# Patient Record
Sex: Female | Born: 1970 | Race: White | Hispanic: No | Marital: Married | Smoking: Current every day smoker
Health system: Southern US, Community
[De-identification: ages and names within clinical notes are randomized; demographics above are authoritative.]

## PROBLEM LIST (undated history)

## (undated) DIAGNOSIS — M549 Dorsalgia, unspecified: Secondary | ICD-10-CM

## (undated) DIAGNOSIS — J45998 Other asthma: Secondary | ICD-10-CM

## (undated) DIAGNOSIS — G8929 Other chronic pain: Secondary | ICD-10-CM

## (undated) DIAGNOSIS — M797 Fibromyalgia: Secondary | ICD-10-CM

## (undated) HISTORY — DX: Fibromyalgia: M79.7

## (undated) HISTORY — PX: BREAST BIOPSY: SHX20

## (undated) HISTORY — DX: Other asthma: J45.998

---

## 2003-06-06 ENCOUNTER — Encounter: Payer: Self-pay | Admitting: Emergency Medicine

## 2003-06-06 ENCOUNTER — Emergency Department (HOSPITAL_COMMUNITY): Admission: EM | Admit: 2003-06-06 | Discharge: 2003-06-07 | Payer: Self-pay | Admitting: *Deleted

## 2004-11-28 ENCOUNTER — Emergency Department (HOSPITAL_COMMUNITY): Admission: EM | Admit: 2004-11-28 | Discharge: 2004-11-28 | Payer: Self-pay | Admitting: Emergency Medicine

## 2005-03-05 ENCOUNTER — Emergency Department: Payer: Self-pay | Admitting: Emergency Medicine

## 2005-03-06 ENCOUNTER — Ambulatory Visit: Payer: Self-pay | Admitting: Emergency Medicine

## 2005-03-14 ENCOUNTER — Ambulatory Visit: Payer: Self-pay | Admitting: Obstetrics and Gynecology

## 2007-02-06 ENCOUNTER — Emergency Department: Payer: Self-pay | Admitting: Emergency Medicine

## 2007-07-30 ENCOUNTER — Inpatient Hospital Stay (HOSPITAL_COMMUNITY): Admission: EM | Admit: 2007-07-30 | Discharge: 2007-08-03 | Payer: Self-pay | Admitting: Emergency Medicine

## 2007-07-30 ENCOUNTER — Ambulatory Visit: Payer: Self-pay | Admitting: Cardiothoracic Surgery

## 2007-08-10 ENCOUNTER — Encounter: Admission: RE | Admit: 2007-08-10 | Discharge: 2007-08-10 | Payer: Self-pay | Admitting: Cardiothoracic Surgery

## 2007-08-10 ENCOUNTER — Ambulatory Visit: Payer: Self-pay | Admitting: Cardiothoracic Surgery

## 2009-07-30 ENCOUNTER — Emergency Department (HOSPITAL_COMMUNITY): Admission: EM | Admit: 2009-07-30 | Discharge: 2009-07-30 | Payer: Self-pay | Admitting: Emergency Medicine

## 2009-12-23 ENCOUNTER — Ambulatory Visit: Payer: Self-pay | Admitting: Obstetrics and Gynecology

## 2010-01-01 ENCOUNTER — Emergency Department: Payer: Self-pay | Admitting: Emergency Medicine

## 2010-06-04 ENCOUNTER — Ambulatory Visit: Payer: Self-pay | Admitting: Sports Medicine

## 2011-03-26 LAB — DIFFERENTIAL
Basophils Absolute: 0 10*3/uL (ref 0.0–0.1)
Basophils Relative: 1 % (ref 0–1)
Eosinophils Absolute: 0.3 10*3/uL (ref 0.0–0.7)
Eosinophils Relative: 6 % — ABNORMAL HIGH (ref 0–5)
Lymphocytes Relative: 42 % (ref 12–46)
Lymphs Abs: 2.5 10*3/uL (ref 0.7–4.0)
Monocytes Absolute: 0.4 10*3/uL (ref 0.1–1.0)
Monocytes Relative: 6 % (ref 3–12)
Neutro Abs: 2.6 10*3/uL (ref 1.7–7.7)
Neutrophils Relative %: 45 % (ref 43–77)

## 2011-03-26 LAB — POCT I-STAT, CHEM 8
BUN: 13 mg/dL (ref 6–23)
Calcium, Ion: 1.17 mmol/L (ref 1.12–1.32)
Chloride: 106 mEq/L (ref 96–112)
Creatinine, Ser: 0.6 mg/dL (ref 0.4–1.2)
Glucose, Bld: 75 mg/dL (ref 70–99)
HCT: 41 % (ref 36.0–46.0)
Hemoglobin: 13.9 g/dL (ref 12.0–15.0)
Potassium: 4.6 mEq/L (ref 3.5–5.1)
Sodium: 140 mEq/L (ref 135–145)
TCO2: 26 mmol/L (ref 0–100)

## 2011-03-26 LAB — POCT CARDIAC MARKERS
CKMB, poc: <1 ng/mL — ABNORMAL LOW (ref 1.0–8.0)
Myoglobin, poc: 38.5 ng/mL (ref 12–200)
Troponin i, poc: <0.05 ng/mL (ref 0.00–0.09)

## 2011-03-26 LAB — CBC
HCT: 39.6 % (ref 36.0–46.0)
Hemoglobin: 13.5 g/dL (ref 12.0–15.0)
MCHC: 34.2 g/dL (ref 30.0–36.0)
MCV: 95.1 fL (ref 78.0–100.0)
Platelets: 459 10*3/uL — ABNORMAL HIGH (ref 150–400)
RBC: 4.16 MIL/uL (ref 3.87–5.11)
RDW: 13.5 % (ref 11.5–15.5)
WBC: 5.8 10*3/uL (ref 4.0–10.5)

## 2011-03-26 LAB — D-DIMER, QUANTITATIVE: D-Dimer, Quant: 0.32 ug/mL-FEU (ref 0.00–0.48)

## 2011-05-03 NOTE — H&P (Signed)
NAMESANTOSHA, Sabrina [name]               ACCOUNT NO.:  [id number]   MEDICAL RECORD NO.:  [id number]          PATIENT TYPE:  INP   LOCATION:  2010                         FACILITY:  [facility]   PHYSICIAN:  Kerin Perna, M.D.  DATE OF BIRTH:  09/28/1971   DATE OF ADMISSION:  07/30/2007  DATE OF DISCHARGE:                              HISTORY & PHYSICAL   ADMISSION DIAGNOSIS:  Spontaneous left pneumothorax, 15-20%.   CHIEF COMPLAINT:  Left chest pain.   PRIMARY CARE PHYSICIAN:  None.   PRESENT ILLNESS:  Sabrina [name] is a 40 year old smoker who presented to  the emergency department with her first episode of a left spontaneous  pneumothorax.  She has been a heavy smoker and has been unable to quit  despite several attempts.  She developed a pleuritic left chest pain  while driving on the date of admission.  There was no preceding trauma  or fall or heavy coughing.  She reported to the emergency department,  where her saturation was 98% on room air and her pulse was 70.  An EKG  was negative and her cardiac enzymes were negative.  A chest x-ray  showed a left apical pneumothorax of 15-20%.  She also had evidence of  COPD.   The patient had CT scans of the chest and abdomen in 2007 for pain.  The  chest CT scan indicates significant bullous emphysema bilaterally with  COPD and some evidence of pulmonary fibrosis.  The abdominal CT scan was  reported as negative.   PAST MEDICAL HISTORY:  1. Positive smoker.  2. No known drug allergies, codeine makes her nauseated.  3. History of a D&C, otherwise negative surgical history.   SOCIAL HISTORY:  The patient is employed, with a daughter.  She denies  alcohol but she smokes.   REVIEW OF SYSTEMS:  CONSTITUTIONAL:  No changes of weight loss or fever.  ENT:  Negative for difficulty swallowing or change in vision.  THORACIC:  Negative for a history of chest trauma, hemoptysis or prior history of  pneumothorax.  CARDIAC:  Negative.  GI:  Negative  for hepatitis,  jaundice or blood per rectum.  VASCULAR:  Negative for DVT.  ENDOCRINE:  Negative for diabetes or thyroid disease.  NEUROLOGIC:  Negative for  stroke or seizure.   PHYSICAL EXAMINATION:  She is 5 feet 3 inches and weight 120 pounds.  Vital signs:  Blood pressure 90/60, pulse 80, temperature 97.5,  saturations 95% on 2 L.  GENERAL APPEARANCE:  That of an appearance young white female in no  acute distress, on nasal cannula oxygen.  HEENT:  Normocephalic.  NECK:  Without crepitus or mass.  Breath sounds are slightly diminished on the left with mild rhonchi.  CARDIAC:  Regular rhythm without S3 gallop or murmur.  ABDOMEN:  Scaphoid, soft, without organomegaly.  EXTREMITIES:  No clubbing or cyanosis.  Peripheral pulses are intact.  NEUROLOGIC:  Intact.   LABORATORY DATA:  Her chest x-ray indicates a small left apical  pneumothorax with underlying fairly severe COPD.  Her white count is  9000, hematocrit is 41, platelet count  353.  Sodium 137, potassium 4.3,  BUN 5, glucose 93.   IMPRESSION AND PLAN:  The patient presents with first onset of a fairly  small spontaneous pneumothorax.  She has underlying significant chronic  obstructive pulmonary disease and bullous emphysema.  I do not feel a  chest tube placement is indicated, and we will admit the patient for  observation and follow.  She very well may need VATS with bleb resection  and pleurodesis; however, at this point proceeding with surgery would  probably expose her to significant pulmonary risks from active  bronchitis from heavy smoking, and we will make every effort to get her  to stop smoking.      Kerin Perna, M.D.  Electronically Signed     PV/MEDQ  D:  07/31/2007  T:  07/31/2007  Job:  [id number]

## 2011-05-03 NOTE — Assessment & Plan Note (Signed)
OFFICE VISIT   Sabrina [name], Sabrina [name]  DOB:  1971/02/05                                        August 10, 2007  CHART #:  [id number]   OFFICE NOTE   CURRENT PROBLEMS  1. Small spontaneous left pneumothorax, treated conservatively      07/30/2007.  2. History of smoking and tobacco abuse.  3. COPD on CT scan of the chest.   HISTORY OF PRESENT ILLNESS  Sabrina [name] returns for her first office visit after being  hospitalized for a left 15% to 20% spontaneous pneumothorax with  considerable pain which was managed conservatively with subsequent re-  expansion of the lung without a chest tube.  She has stopped smoking and  is taking only Ultram for pain now.  She does complain of constipation.  She is back at work and avoiding any heavy lifting or exertional  activities.  The chest pain has resolved, and her breathing is fine.   PHYSICAL EXAMINATION  Blood pressure 125/80, pulse 90, respirations 18, saturation 100%.  Breath sounds are clear and equal.  There is no crepitus.  Cardiac  rhythm is regular, and pulses are intact.   LABORATORY DATA  PA and lateral chest x-ray shows resolution of the pneumothorax with  clear lung fields.   IMPRESSION  Resolution of first episode of spontaneous left pneumothorax.  She knows  to obtain a chest x-ray  immediately if her symptoms recur.  She was again encouraged to remain  off cigarettes permanently.  She will return as needed.   Kerin Perna, M.D.  Electronically Signed   PV/MEDQ  D:  08/10/2007  T:  08/11/2007  Job:  [id number]   cc:   Loma Sender

## 2011-05-03 NOTE — Discharge Summary (Signed)
[name], [name]               ACCOUNT NO.:  [id number]   MEDICAL RECORD NO.:  [id number]          PATIENT TYPE:  INP   LOCATION:  2010                         FACILITY:  [facility]   PHYSICIAN:  Sabrina [name], M.D.  DATE OF BIRTH:  04/15/1971   DATE OF ADMISSION:  07/30/2007  DATE OF DISCHARGE:  08/03/2007                               DISCHARGE SUMMARY   HISTORY OF PRESENT ILLNESS:  The patient is a 40 year old smoker who  presented to the emergency department with a first episode of a left-  sided spontaneous pneumothorax.  She has been a heavy smoker and has  been unable to quit despite several attempts.  She developed a pleuritic  left chest pain while driving on the date of admission.  There was no  preceding trauma or fall or heavy coughing episode.  She presented to  the emergency department where her oxygen saturation was 98% on room air  and her pulse was 70.  An EKG was negative and her cardiac enzymes were  negative.  A chest x-ray showed a left apical pneumothorax of 15 to 20%  with evidence of COPD.  Patient has a history of previous CT scan of the  chest and abdomen in 2007 which shows significant bullous emphysema  bilaterally with COPD and some evidence of pulmonary fibrosis.  She was  felt to require admission for further evaluation and observation.   PAST MEDICAL HISTORY:  1. Tobacco abuse.  2. No known drug allergies.  CODEINE DOES MAKE HER NAUSEATED.  3. History of D&C, otherwise negative surgical history.   FAMILY HISTORY/SOCIAL HISTORY/REVIEW OF SYMPTOMS/PHYSICAL EXAMINATION:  Please see the history and physical done at the time of admission.   HOSPITAL COURSE:  Patient was admitted through the emergency room after  evaluation by Dr. Donata [name].  She was started on a course of Avelox for  evidence of bronchitis.  Her pain was controlled with narcotics.  A CT  scan was repeated on August 14 and this revealed moderate-to-severe bi-  apical paraseptal emphysema  with a 15 to 20% pneumothorax and a tiny  left effusion.  The patient showed some gradual improvement in her  pneumothorax on chest x-ray evaluation.  Her pain is almost entirely  resolved.  Her bronchitis symptoms have also improved.  She was deemed  to be acceptable for discharge on August 03, 2007.   FINAL DIAGNOSES:  Include moderate-to-severe emphysema with presenting  left-sided small pneumothorax which is stable.   OTHER DIAGNOSES:  Include chronic obstructive pulmonary disease with  tobacco abuse.   INSTRUCTIONS:  The patient has received written instructions in regard  to medications, activity, diet, wound care and followup.  She has also  received instructions on the smoking cessation program and has been  started on a Nicotine patch.   Other medications at discharge will include:  1. Ultram 50 mg one q.6 hours as needed.  2. Avelox 400 mg daily for an additional five days.   Followup will include a one-week appointment to see Dr. Donata [name] in the  office with a chest x-ray.  Sabrina [name], P.A.-C.      Sabrina [name], M.D.  Electronically Signed    WEG/MEDQ  D:  08/03/2007  T:  08/03/2007  Job:  [id number]

## 2011-10-03 LAB — CBC
HCT: 40.7
Hemoglobin: 13.9
MCHC: 34.1
MCV: 91.8
Platelets: 353
RBC: 4.43
RDW: 14.6 — ABNORMAL HIGH
WBC: 9

## 2011-10-03 LAB — BASIC METABOLIC PANEL
BUN: 5 — ABNORMAL LOW
CO2: 30
Calcium: 9.6
Chloride: 101
Creatinine, Ser: 0.75
GFR calc Af Amer: >60
GFR calc non Af Amer: >60
Glucose, Bld: 93
Potassium: 4.3
Sodium: 137

## 2011-10-03 LAB — DIFFERENTIAL
Basophils Absolute: 0
Basophils Relative: 0
Eosinophils Absolute: 0.2
Eosinophils Relative: 2
Lymphocytes Relative: 34
Lymphs Abs: 3
Monocytes Absolute: 0.7
Monocytes Relative: 8
Neutro Abs: 5
Neutrophils Relative %: 56

## 2011-10-03 LAB — URINALYSIS, ROUTINE W REFLEX MICROSCOPIC
Nitrite: NEGATIVE
Specific Gravity, Urine: 1.004 — ABNORMAL LOW
pH: 7.5

## 2011-10-03 LAB — POCT CARDIAC MARKERS
CKMB, poc: <1 — ABNORMAL LOW
Myoglobin, poc: 59.5
Operator id: 151321
Troponin i, poc: <0.05

## 2011-10-03 LAB — PREGNANCY, URINE: Preg Test, Ur: NEGATIVE

## 2011-10-03 LAB — D-DIMER, QUANTITATIVE: D-Dimer, Quant: <0.22

## 2011-10-13 ENCOUNTER — Encounter: Payer: Self-pay | Admitting: Family Medicine

## 2011-10-13 ENCOUNTER — Ambulatory Visit (INDEPENDENT_AMBULATORY_CARE_PROVIDER_SITE_OTHER): Payer: BC Managed Care – PPO | Admitting: Family Medicine

## 2011-10-13 DIAGNOSIS — M7710 Lateral epicondylitis, unspecified elbow: Secondary | ICD-10-CM

## 2011-10-13 DIAGNOSIS — M67919 Unspecified disorder of synovium and tendon, unspecified shoulder: Secondary | ICD-10-CM

## 2011-10-13 DIAGNOSIS — M7580 Other shoulder lesions, unspecified shoulder: Secondary | ICD-10-CM

## 2011-10-13 DIAGNOSIS — M7711 Lateral epicondylitis, right elbow: Secondary | ICD-10-CM

## 2011-10-13 MED ORDER — NITROGLYCERIN 0.2 MG/HR TD PT24: MEDICATED_PATCH | TRANSDERMAL | Status: DC

## 2011-10-13 MED ORDER — TRAMADOL HCL 50 MG PO TABS: 50.0000 mg | ORAL_TABLET | Freq: Four times a day (QID) | ORAL | Status: AC | PRN

## 2011-10-13 NOTE — Progress Notes (Signed)
PT referral info sent to [facility] in [place], [place]- requested [place]. Faxed to [phone].

## 2011-10-13 NOTE — Patient Instructions (Signed)
Rehab at [facility].  F/u 6 weeks

## 2011-10-14 ENCOUNTER — Encounter: Payer: Self-pay | Admitting: Family Medicine

## 2011-10-14 NOTE — Progress Notes (Signed)
Subjective:    Patient ID: Sabrina [name], female    DOB: 1971/02/29, 40 y.o.   MRN: [id number]  HPI  Pleasant female with a history of asthma and fibromyalgia who presents with a six-month history of escalating RIGHT lateral epicondylitis that is very severe in nature and  Has become relatively debilitating. She has no specific trauma or injury that she can recall. She also started to develop some right-sided shoulder pain within the last few months. She has pain at all times, and has difficulty with moving her elbow and is only able to sleep with propping up her elbow right now. She has no trauma or injury to this side. It has been gradual in onset but continued to become worse.  No radiculopathy, paresthesias. She did see a physician at [facility] orthopedics previously, and was evaluated and given a pneumatic arm band and a corticosteroid injection at the [facility] which lasted for 2 days.  Right-sided shoulder pain, pain with abduction, reaching across the body. No known injury. She has been feeling erring her RIGHT arm significantly and trying to minimally use it over the last few months. No prior traumatic fracture or injury or operative intervention the affected arm.  She does work at a [employer] and does do some repetitive motions.  There is no problem list on file for this patient.  Past Medical History  Diagnosis Date  . Asthma in remission   . Fibromyalgia    No past surgical history on file. History  Substance Use Topics  . Smoking status: Current Everyday Smoker -- 1.0 packs/day for 15 years  . Smokeless tobacco: Never Used  . Alcohol Use: Yes   Family History  Problem Relation Age of Onset  . Diabetes Other   . Hypertension     Allergies  Allergen Reactions  . Augmentin Swelling    Mouth burning and bumps, swelling  . Codeine Rash and Other (See Comments)    Abdominal pain   No current outpatient prescriptions on file prior to visit.    Review of  Systems REVIEW OF SYSTEMS  GEN: No fevers, chills. Nontoxic. Primarily MSK c/o today. MSK: Detailed in the HPI GI: tolerating PO intake without difficulty Neuro: No numbness, parasthesias, or tingling associated. Otherwise the pertinent positives of the ROS are noted above.      Objective:   Physical Exam   Physical Exam  Blood pressure 105/73, pulse 87, height 5\' 2"  (1.575 m), weight 124 lb (56.246 kg).  GEN: Well-developed,well-nourished,in no acute distress; alert,appropriate and cooperative throughout examination HEENT: Normocephalic and atraumatic without obvious abnormalities. Ears, externally no deformities PULM: Breathing comfortably in no respiratory distress EXT: No clubbing, cyanosis, or edema PSYCH: Normally interactive. Cooperative during the interview. Pleasant. Friendly and conversant. Not anxious or depressed appearing. Normal, full affect.  R elbow Ecchymosis or edema: neg Wrist extensor atrophy ROM: full flexion, extension, pronation, supination - she is able to achieve full range of motion, but has severe pain with terminal extension, flexion supination and pronation. Markedly tender to palpation, and around the lateral upper condyle even with slight touch Supination: 3+/5, PAINFUL Pronation: 4+/5 Wrist ext: 4-/5, painful Wrist flexion: 4+/5, painful No gross bony abnormality Varus and Valgus stress: stable ECRB tenderness: YES, TTP Medial epicondyle: NT Lateral epicondyle, resisted wrist extension from wrist full pronation and flexion: PAINFUL grip: 4/5  sensation intact  Shoulder: R Inspection: No muscle wasting or winging Ecchymosis/edema: neg  AC joint, scapula, clavicle: NT Cervical spine: NT, full ROM Spurling's:  neg Abduction: full, 5/5 Flexion: full, 5/5 IR, full, lift-off: 5/5 ER at neutral: full, 5/5 AC crossover: neg Neer: pos Hawkins: pos Drop Test: neg Empty Can: pos Supraspinatus insertion: mild-mod T Bicipital groove:  NT Speed's: neg Yergason's: neg Sulcus sign: neg Scapular dyskinesis: none C5-T1 intact  Neuro: Sensation intact Grip 5/5  Diagnostic Ultrasound Evaluation [employer] E, MSK ultrasound, MSK probe Anatomy scanned: R LE Indication: Pain Findings: at the point of maximal tenderness and at and around ECRB, there is no sign of acute tear in the tendon. There are a few small hyperechoic areas near the tendinous attachment which could correspond to calcification or prior injury. There appears to be no elbow effusion in the true elbow joint looks normal on ultrasound evaluation. Only one image was able to be saved due to some technical difficulties.       Assessment & Plan:   1. Right lateral epicondylitis  Ambulatory referral to Physical Therapy  2. Rotator cuff tendonitis  Ambulatory referral to Physical Therapy    Severe lateral epicondylitis, worsening. Emphasized the importance of home exercise, eccentric. At this point we're going to try  Her with some formal physical therapy, some Decadron iontophoresis at the lateral upper condyle, ASTYMM technique, and place her on NTG patch for her elbow.  Additionally, at the same time, will be the therapist work with her on her shoulder range of motion, and rotator cuff strengthening and scapular stabilization. I think she is developed some secondary cuff tendinopathy.  Recheck 5-6 weeks  The EHR lists Dr. Ashley Jacobs as provider, but I saw patient by myself as attending MD. I do not know how to alter this in the computer record.

## 2011-10-31 ENCOUNTER — Other Ambulatory Visit: Payer: Self-pay | Admitting: *Deleted

## 2011-10-31 MED ORDER — NITROGLYCERIN 0.2 MG/HR TD PT24: MEDICATED_PATCH | TRANSDERMAL | Status: DC

## 2011-12-01 ENCOUNTER — Ambulatory Visit: Payer: BC Managed Care – PPO | Admitting: Family Medicine

## 2012-12-19 HISTORY — PX: BREAST BIOPSY: SHX20

## 2013-05-08 ENCOUNTER — Ambulatory Visit: Payer: Self-pay | Admitting: Family Medicine

## 2013-05-15 ENCOUNTER — Ambulatory Visit: Payer: Self-pay | Admitting: Family Medicine

## 2013-06-03 ENCOUNTER — Ambulatory Visit: Payer: Self-pay | Admitting: Surgery

## 2013-06-04 LAB — PATHOLOGY REPORT

## 2013-06-17 ENCOUNTER — Ambulatory Visit: Payer: Self-pay | Admitting: General Surgery

## 2013-12-20 ENCOUNTER — Ambulatory Visit: Payer: Self-pay | Admitting: Surgery

## 2014-02-27 ENCOUNTER — Emergency Department (HOSPITAL_COMMUNITY): Payer: BC Managed Care – PPO

## 2014-02-27 ENCOUNTER — Emergency Department (HOSPITAL_COMMUNITY)
Admission: EM | Admit: 2014-02-27 | Discharge: 2014-02-28 | Disposition: A | Discharge status: Home / Self Care | Payer: BC Managed Care – PPO | Attending: Emergency Medicine | Admitting: Emergency Medicine

## 2014-02-27 ENCOUNTER — Encounter (HOSPITAL_COMMUNITY): Payer: Self-pay | Admitting: Emergency Medicine

## 2014-02-27 DIAGNOSIS — G8929 Other chronic pain: Secondary | ICD-10-CM | POA: Insufficient documentation

## 2014-02-27 DIAGNOSIS — G8911 Acute pain due to trauma: Secondary | ICD-10-CM | POA: Insufficient documentation

## 2014-02-27 DIAGNOSIS — R110 Nausea: Secondary | ICD-10-CM | POA: Insufficient documentation

## 2014-02-27 DIAGNOSIS — M545 Low back pain, unspecified: Secondary | ICD-10-CM

## 2014-02-27 DIAGNOSIS — M25551 Pain in right hip: Secondary | ICD-10-CM

## 2014-02-27 DIAGNOSIS — M25559 Pain in unspecified hip: Secondary | ICD-10-CM | POA: Insufficient documentation

## 2014-02-27 HISTORY — DX: Other chronic pain: G89.29

## 2014-02-27 HISTORY — DX: Dorsalgia, unspecified: M54.9

## 2014-02-27 MED ORDER — OXYCODONE-ACETAMINOPHEN 5-325 MG PO TABS
1.0000 | ORAL_TABLET | Freq: Once | ORAL | Status: AC
  Administered 2014-02-27: 1 via ORAL
  Filled 2014-02-27: qty 1

## 2014-02-27 MED ORDER — METHOCARBAMOL 500 MG PO TABS: 500.0000 mg | ORAL_TABLET | Freq: Two times a day (BID) | ORAL | Status: DC

## 2014-02-27 MED ORDER — KETOROLAC TROMETHAMINE 60 MG/2ML IM SOLN
60.0000 mg | Freq: Once | INTRAMUSCULAR | Status: AC
  Administered 2014-02-27: 60 mg via INTRAMUSCULAR
  Filled 2014-02-27: qty 2

## 2014-02-27 MED ORDER — NAPROXEN 500 MG PO TABS: 500.0000 mg | ORAL_TABLET | Freq: Two times a day (BID) | ORAL | Status: DC

## 2014-02-27 NOTE — ED Notes (Signed)
Pt states she has previous history of back "locking up" and compression of nerves. The treatment was physical therapy resolved without further treatment.

## 2014-02-27 NOTE — Discharge Instructions (Signed)
Make follow up appointment with [facility] as listed above. Take medications as directed for back pain/hip pain. Return to ED if you develop any worsening symptoms, leg numbness/weakness, or difficulty urinating.    Back Pain, Adult Back pain is very common. The pain often gets better over time. The cause of back pain is usually not dangerous. Most people can learn to manage their back pain on their own.  HOME CARE   Stay active. Start with short walks on flat ground if you can. Try to walk farther each day.  Do not sit, drive, or stand in one place for more than 30 minutes. Do not stay in bed.  Do not avoid exercise or work. Activity can help your back heal faster.  Be careful when you bend or lift an object. Bend at your knees, keep the object close to you, and do not twist.  Sleep on a firm mattress. Lie on your side, and bend your knees. If you lie on your back, put a pillow under your knees.  Only take medicines as told by your doctor.  Put ice on the injured area.  Put ice in a plastic bag.  Place a towel between your skin and the bag.  Leave the ice on for 15-20 minutes, 03-04 times a day for the first 2 to 3 days. After that, you can switch between ice and heat packs.  Ask your doctor about back exercises or massage.  Avoid feeling anxious or stressed. Find good ways to deal with stress, such as exercise. GET HELP RIGHT AWAY IF:   Your pain does not go away with rest or medicine.  Your pain does not go away in 1 week.  You have new problems.  You do not feel well.  The pain spreads into your legs.  You cannot control when you poop (bowel movement) or pee (urinate).  Your arms or legs feel weak or lose feeling (numbness).  You feel sick to your stomach (nauseous) or throw up (vomit).  You have belly (abdominal) pain.  You feel like you may pass out (faint). MAKE SURE YOU:   Understand these instructions.  Will watch your condition.  Will get  help right away if you are not doing well or get worse. Document Released: 05/23/2008 Document Revised: 02/27/2012 Document Reviewed: 04/25/2011 [facility] Patient Information 2014 [place] [employer], [place].

## 2014-02-27 NOTE — ED Notes (Signed)
Pt. reports worsening chronic right low back pain radiating to right leg  , seen by Dr. Floyce StakesGaines yesterday prescribed with Tramadol/Flexeril for her back pain with no relief . Denies recent fall or injury.

## 2014-02-27 NOTE — ED Notes (Signed)
Patient transported to X-ray 

## 2014-02-27 NOTE — ED Provider Notes (Signed)
CSN: [id number]     Arrival date & time 02/27/14  2008 History  This chart was scribed for non-physician practitioner [name], PA-C working with Darlys Galesavid Masneri, MD by Valera CastleSteven Perry, ED scribe. This patient was seen in room TR08C/TR08C and the patient's care was started at 9:34 PM.   Chief Complaint  Patient presents with  . Back Pain   (Consider location/radiation/quality/duration/timing/severity/associated sxs/prior Treatment) The history is provided by the patient. No language interpreter was used.   HPI Comments: Sabrina [name] is a 43 y.o. female who presents to the Emergency Department complaining of gradually worsening, spasmic, cramping, 8/10, right hip pain, with associated decreased ROM, onset 3:00PM today while sitting at her computer. She states when she went to turn in her chair to get up her hip locked up. She reports associated tingling/numbness in her right LE, and nausea due to the pain. She [facility]  She states he set up appointment for her at PT on Monday 03/03/14. She was given Tramadol and Flexeril that she has taken without relief. She last took both around 5:30PM.   She had similar hip pain, decreased ROM in 2011, went to PT. She states the therapist had her in certain position that "popped" her "back into place", with immediate relief.  She reports h/o back problems, dysfunctioning SI joint. 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 She denies any other symptoms. She reports an allergy to Codeine.      Past Medical History  Diagnosis Date  . Back pain, chronic    History reviewed. No pertinent past surgical history. No family history on file. History  Substance Use Topics  . Smoking status: Never Smoker   . Smokeless tobacco: Not on file  . Alcohol Use: No   OB History   Grav Para Term Preterm Abortions TAB SAB Ect Mult Living                 Review of Systems  Genitourinary: Negative.   All other  systems reviewed and are negative.   Allergies  Codeine  Home Medications   Current Outpatient Rx  Name  Route  Sig  Dispense  Refill  . albuterol (PROVENTIL HFA;VENTOLIN HFA) 108 (90 BASE) MCG/ACT inhaler   Inhalation   Inhale 2 puffs into the lungs every 6 (six) hours as needed for wheezing or shortness of breath.         . cyclobenzaprine (FLEXERIL) 5 MG tablet   Oral   Take 5 mg by mouth 3 (three) times daily as needed for muscle spasms.         . naproxen sodium (ANAPROX) 220 MG tablet   Oral   Take 440 mg by mouth daily as needed (swelling).         . traMADol (ULTRAM) 50 MG tablet   Oral   Take 50 mg by mouth every 4 (four) hours as needed for moderate pain.         . methocarbamol (ROBAXIN) 500 MG tablet   Oral   Take 1 tablet (500 mg total) by mouth 2 (two) times daily.   20 tablet   0   . naproxen (NAPROSYN) 500 MG tablet   Oral   Take 1 tablet (500 mg total) by mouth 2 (two) times daily.   30 tablet   0     BP 98/61  Pulse 74  Temp(Src) 98.2 F (36.8 C) (Oral)  Resp  16  Ht 5' 2.5" (1.588 m)  Wt 121 lb (54.885 kg)  BMI 21.76 kg/m2  SpO2 100%  Physical Exam  Nursing note and vitals reviewed. Constitutional: She is oriented to person, place, and time. She appears well-developed and well-nourished. No distress.  HENT:  Head: Normocephalic and atraumatic.  Eyes: Conjunctivae are normal.  Neck: No JVD present. No tracheal deviation present.  Cardiovascular: Normal rate, regular rhythm and intact distal pulses.  Exam reveals no gallop and no friction rub.   No murmur heard. Bilateral pedal pulses intact.   Pulmonary/Chest: Effort normal. No respiratory distress. She has no wheezes. She has no rhonchi. She has no rales.  Musculoskeletal: She exhibits tenderness. She exhibits no edema.       Right hip: She exhibits tenderness.  Bilateral Lower extremity sensation intact, with no evidence of numbness.  Neurological: She is alert and oriented to  person, place, and time. She has normal strength. No cranial nerve deficit or sensory deficit.  Reflex Scores:      Patellar reflexes are 2+ on the right side and 2+ on the left side. Skin: Skin is warm and dry. She is not diaphoretic.  Psychiatric: She has a normal mood and affect. Her behavior is normal.   ED Course  Procedures (including critical care time)  DIAGNOSTIC STUDIES: Oxygen Saturation is 100% on room air, normal by my interpretation.    COORDINATION OF CARE: 9:43 PM-Discussed treatment plan which includes right hip xray and pain medication with pt at bedside and pt agreed to plan.   Labs Review Labs Reviewed - No data to display Imaging Review Dg Hip Complete Right  02/27/2014   CLINICAL DATA:  Right hip pain, no known injury.  EXAM: RIGHT HIP - COMPLETE 2+ VIEW  COMPARISON:  None.  FINDINGS: No displaced fracture. No dislocation. No aggressive osseous lesion or overt degenerative changes. Overlying soft tissues unremarkable.  IMPRESSION: No acute osseous finding of the right hip.  Recommend MRI if concern for radiographically occult pathology persists.   Electronically Signed   By: Jearld Lesch M.D.   On: 02/27/2014 23:23     MDM   Final diagnoses:  LBP (low back pain)  Right hip pain  Patient afebrile with normal VS.  Plain films show no acute osseous findings of Right hip. Patient without midline spinal tenderness on Exam thus no need for further imaging at this time. Patient Lower Right Lumbar paraspinal muscles tight and tender to palpation. Suspect some degree of spasm.  The patient has no upper back or neck pain, no fever, no hx of cancer, IVDU, recent spinal procedures, weakness or numbness of the lower extremities and no urinary complaints including no retention or incontinence  Patient pain markedly improved with tx in ED. Plan to start patient on short course of Muscle relaxants and NSAIDs. Advised patient to discontinue Flexeril. Patient advised to keep  appointment with PT on Monday. Patient agrees with plan. Discharged in good condition.    Meds given in ED:  Medications  ketorolac (TORADOL) injection 60 mg (60 mg Intramuscular Given 02/27/14 2212)  oxyCODONE-acetaminophen (PERCOCET/ROXICET) 5-325 MG per tablet 1 tablet (1 tablet Oral Given 02/27/14 2212)    Discharge Medication List as of 02/27/2014 11:47 PM    START taking these medications   Details  methocarbamol (ROBAXIN) 500 MG tablet Take 1 tablet (500 mg total) by mouth 2 (two) times daily., Starting 02/27/2014, Until Discontinued, Print    naproxen (NAPROSYN) 500 MG tablet Take 1 tablet (  500 mg total) by mouth 2 (two) times daily., Starting 02/27/2014, Until Discontinued, Print       I personally performed the services described in this documentation, which was scribed in my presence. The recorded information has been reviewed and is accurate.    Rudene Anda, PA-C 03/01/14 1712

## 2014-03-03 NOTE — ED Provider Notes (Signed)
Medical screening examination/treatment/procedure(s) were performed by non-physician practitioner and as supervising physician I was immediately available for consultation/collaboration.   EKG Interpretation None        David Masneri, MD 03/03/14 1834 

## 2014-12-31 ENCOUNTER — Ambulatory Visit: Payer: Self-pay | Admitting: Family Medicine

## 2016-03-11 ENCOUNTER — Other Ambulatory Visit: Payer: Self-pay | Admitting: Family Medicine

## 2016-03-11 DIAGNOSIS — Z1231 Encounter for screening mammogram for malignant neoplasm of breast: Secondary | ICD-10-CM

## 2016-03-15 ENCOUNTER — Encounter: Payer: Self-pay | Admitting: Family Medicine

## 2016-03-28 ENCOUNTER — Ambulatory Visit: Payer: Self-pay | Attending: Family Medicine

## 2016-06-17 ENCOUNTER — Emergency Department
Admission: EM | Admit: 2016-06-17 | Discharge: 2016-06-17 | Disposition: A | Discharge status: Home / Self Care | Payer: BLUE CROSS/BLUE SHIELD | Attending: Emergency Medicine | Admitting: Emergency Medicine

## 2016-06-17 DIAGNOSIS — Z79899 Other long term (current) drug therapy: Secondary | ICD-10-CM | POA: Diagnosis not present

## 2016-06-17 DIAGNOSIS — J45909 Unspecified asthma, uncomplicated: Secondary | ICD-10-CM | POA: Diagnosis not present

## 2016-06-17 DIAGNOSIS — Z791 Long term (current) use of non-steroidal anti-inflammatories (NSAID): Secondary | ICD-10-CM | POA: Diagnosis not present

## 2016-06-17 DIAGNOSIS — M5441 Lumbago with sciatica, right side: Secondary | ICD-10-CM | POA: Diagnosis not present

## 2016-06-17 DIAGNOSIS — M545 Low back pain: Secondary | ICD-10-CM | POA: Diagnosis present

## 2016-06-17 LAB — COMPREHENSIVE METABOLIC PANEL
ALK PHOS: 85 U/L (ref 38–126)
ALT: 14 U/L (ref 14–54)
ANION GAP: 11 (ref 5–15)
AST: 21 U/L (ref 15–41)
Albumin: 4.7 g/dL (ref 3.5–5.0)
BILIRUBIN TOTAL: 0.5 mg/dL (ref 0.3–1.2)
BUN: 11 mg/dL (ref 6–20)
CALCIUM: 9.9 mg/dL (ref 8.9–10.3)
CO2: 28 mmol/L (ref 22–32)
Chloride: 100 mmol/L — ABNORMAL LOW (ref 101–111)
Creatinine, Ser: 0.71 mg/dL (ref 0.44–1.00)
GFR calc non Af Amer: >60 mL/min (ref >60)
Glucose, Bld: 99 mg/dL (ref 65–99)
Potassium: 4.2 mmol/L (ref 3.5–5.1)
SODIUM: 139 mmol/L (ref 135–145)
TOTAL PROTEIN: 8.2 g/dL — AB (ref 6.5–8.1)

## 2016-06-17 LAB — CBC WITH DIFFERENTIAL/PLATELET
Basophils Absolute: 0.1 10*3/uL (ref 0–0.1)
Basophils Relative: 1 %
EOS ABS: 0.2 10*3/uL (ref 0–0.7)
Eosinophils Relative: 3 %
HEMATOCRIT: 43.7 % (ref 35.0–47.0)
HEMOGLOBIN: 15 g/dL (ref 12.0–16.0)
LYMPHS ABS: 2.1 10*3/uL (ref 1.0–3.6)
Lymphocytes Relative: 25 %
MCH: 31 pg (ref 26.0–34.0)
MCHC: 34.4 g/dL (ref 32.0–36.0)
MCV: 90.3 fL (ref 80.0–100.0)
MONOS PCT: 6 %
Monocytes Absolute: 0.5 10*3/uL (ref 0.2–0.9)
NEUTROS PCT: 65 %
Neutro Abs: 5.4 10*3/uL (ref 1.4–6.5)
Platelets: 357 10*3/uL (ref 150–440)
RBC: 4.84 MIL/uL (ref 3.80–5.20)
RDW: 13.7 % (ref 11.5–14.5)
WBC: 8.2 10*3/uL (ref 3.6–11.0)

## 2016-06-17 LAB — URINALYSIS COMPLETE WITH MICROSCOPIC (ARMC ONLY)
BILIRUBIN URINE: NEGATIVE
GLUCOSE, UA: NEGATIVE mg/dL
HGB URINE DIPSTICK: NEGATIVE
Ketones, ur: NEGATIVE mg/dL
Leukocytes, UA: NEGATIVE
NITRITE: NEGATIVE
Protein, ur: NEGATIVE mg/dL
RBC / HPF: NONE SEEN RBC/hpf (ref 0–5)
SPECIFIC GRAVITY, URINE: 1.003 — AB (ref 1.005–1.030)
pH: 7 (ref 5.0–8.0)

## 2016-06-17 LAB — TROPONIN I

## 2016-06-17 LAB — POCT PREGNANCY, URINE: PREG TEST UR: NEGATIVE

## 2016-06-17 LAB — PREGNANCY, URINE: Preg Test, Ur: NEGATIVE

## 2016-06-17 MED ORDER — PREDNISONE 20 MG PO TABS: 40.0000 mg | ORAL_TABLET | Freq: Every day | ORAL | Status: DC

## 2016-06-17 MED ORDER — HYDROCODONE-ACETAMINOPHEN 5-325 MG PO TABS
1.0000 | ORAL_TABLET | ORAL | Status: DC | PRN
Start: 2016-06-17 — End: 2020-02-19

## 2016-06-17 NOTE — ED Notes (Signed)
Pt. Verbalizes understanding of d/c instructions, prescriptions, and follow-up. VS stable.  Pt. In NAD at time of d/c and denies concerns. Pt. Out of the unit by wheelchair with husband in good spirits

## 2016-06-17 NOTE — ED Notes (Signed)
Pt states that she felt a significant pop in her rt lower back Saturday, states that she became near syncopal at that time, pt states that after being up for approx 8 min she starts to feel light headed again as if she will soon pass out. Pt states that she has an upcoming appt with gi on the 17th because of frequent bloating and gas after eating. Pt denies losing blood in her stools, states her stools are normal

## 2016-06-17 NOTE — Discharge Instructions (Signed)

## 2016-06-17 NOTE — ED Provider Notes (Addendum)
[facility] Emergency Department Provider Note  Time seen: 3:16 PM  I have reviewed the triage vital signs and the nursing notes.   HISTORY  Chief Complaint Back Pain    HPI Sabrina [name] is a 45 y.o. female with a past medical history of fibromyalgia, chronic back pain, who presents the emergency department with lower back pain. According to the patient approximately 5 days ago she was getting out of the shower when she heard a pop in the back followed by significant lower back pain. Patient states she passed out at that time due to the pain. Has a history of back issues, is taking naproxen and was using an ice pack. She states she feels the pain is not improved, upon walking today she once again felt near syncopal due to the pain she called her orthopedic but they could not see her until Wednesday so she came to the emergency department for evaluation. Describes the pain as moderate while lying down, severe if she moves or attempts to walk. States the pain is mostly in the lower back and occasionally radiates down the right leg. Denies any weakness or numbness of the right leg. Denies any fever. Denies any vomiting.     Past Medical History  Diagnosis Date  . Asthma in remission   . Fibromyalgia   . Back pain, chronic     There are no active problems to display for this patient.   No past surgical history on file.  Current Outpatient Rx  Name  Route  Sig  Dispense  Refill  . albuterol (PROVENTIL HFA;VENTOLIN HFA) 108 (90 BASE) MCG/ACT inhaler   Inhalation   Inhale 2 puffs into the lungs every 6 (six) hours as needed for wheezing or shortness of breath.         . ALBUTEROL IN   Inhalation   Inhale 2 puffs into the lungs 2 (two) times daily as needed.           . cyclobenzaprine (FLEXERIL) 5 MG tablet   Oral   Take 5 mg by mouth 3 (three) times daily as needed for muscle spasms.         . methocarbamol (ROBAXIN) 500 MG tablet    Oral   Take 1 tablet (500 mg total) by mouth 2 (two) times daily.   20 tablet   0   . naproxen (NAPROSYN) 500 MG tablet   Oral   Take 1 tablet (500 mg total) by mouth 2 (two) times daily.   30 tablet   0   . naproxen sodium (ANAPROX) 220 MG tablet   Oral   Take 440 mg by mouth daily as needed (swelling).         . EXPIRED: nitroGLYCERIN (NITRODUR - DOSED IN MG/24 HR) 0.2 mg/hr      Apply 1/4 of a patch to the affected area and change every 24 hours reason tendinopathy    30 patch   4   . pregabalin (LYRICA) 100 MG capsule   Oral   Take 100 mg by mouth 3 (three) times daily.           . traMADol (ULTRAM) 50 MG tablet   Oral   Take 50 mg by mouth every 4 (four) hours as needed for moderate pain.           Allergies Codeine and Codeine  Family History  Problem Relation Age of Onset  . Diabetes Other   . Hypertension  Social History Social History  Substance Use Topics  . Smoking status: Never Smoker   . Smokeless tobacco: Not on file  . Alcohol Use: No    Review of Systems Constitutional: Negative for fever Cardiovascular: Negative for chest pain. Respiratory: Negative for shortness of breath. Gastrointestinal: Negative for abdominal pain Musculoskeletal: Lower back pain occasional radiation to the right leg. Neurological: Negative for headache 10-point ROS otherwise negative.  ____________________________________________   PHYSICAL EXAM:  VITAL SIGNS: ED Triage Vitals  Enc Vitals Group     BP 06/17/16 1336 95/67 mmHg     Pulse Rate 06/17/16 1336 79     Resp 06/17/16 1336 18     Temp 06/17/16 1336 97.6 F (36.4 C)     Temp Source 06/17/16 1336 Oral     SpO2 06/17/16 1336 96 %     Weight 06/17/16 1336 118 lb (53.524 kg)     Height 06/17/16 1336 5\' 2"  (1.575 m)     Head Cir --      Peak Flow --      Pain Score 06/17/16 1346 4     Pain Loc --      Pain Edu? --      Excl. in GC? --     Constitutional: Alert and oriented. Well  appearing and in no distress. Eyes: Normal exam ENT   Head: Normocephalic and atraumatic.   Mouth/Throat: Mucous membranes are moist. Cardiovascular: Normal rate, regular rhythm. No murmur Respiratory: Normal respiratory effort without tachypnea nor retractions. Breath sounds are clear  Gastrointestinal: Soft and nontender. No distention. Musculoskeletal: Moderate tenderness palpation of the mid L-spine, with right-sided paraspinal tenderness to palpation. Neurologic:  Normal speech and language. No gross focal neurologic deficits Skin:  Skin is warm, dry and intact.  Psychiatric: Mood and affect are normal  ____________________________________________    INITIAL IMPRESSION / ASSESSMENT AND PLAN / ED COURSE  Pertinent labs & imaging results that were available during my care of the patient were reviewed by me and considered in my medical decision making (see chart for details).  The patient presents to the emergency department with lower back pain. States occasional radiation down the right leg. Patient has a moderate tenderness palpation of the mid lumbar spine with right-sided paraspinal tenderness palpation. Neurologically intact. Overall the patient appears well, likely acute exacerbation of chronic back condition. We'll discharge with a short course of pain medication and a burst course of prednisone. Patient is agreeable to this plan.  I discussed imaging with the patient, she states she would prefer not to have imaging if I thought it was okay. As the patient has not experienced any trauma I do not believe imaging would be of much utility at this point and the patient does have follow-up with orthopedics in 5 days.  EKG reviewed and interpreted by myself shows normal sinus rhythm at 73 bpm, narrow QRS, normal axis, normal intervals, no ST changes. Normal EKG.  Labs are largely within normal limits including urinalysis and negative  troponin.  ____________________________________________   FINAL CLINICAL IMPRESSION(S) / ED DIAGNOSES  Lower back pain   Minna AntisKevin Laken Lobato, MD 06/17/16 1519  Minna AntisKevin Knut Rondinelli, MD 06/17/16 1520  Minna AntisKevin Bernese Doffing, MD 06/17/16 [phone]

## 2016-07-04 ENCOUNTER — Other Ambulatory Visit: Payer: Self-pay | Admitting: Student

## 2016-07-04 DIAGNOSIS — R1084 Generalized abdominal pain: Secondary | ICD-10-CM

## 2016-07-04 DIAGNOSIS — R140 Abdominal distension (gaseous): Secondary | ICD-10-CM

## 2016-07-11 ENCOUNTER — Ambulatory Visit
Admission: RE | Admit: 2016-07-11 | Discharge: 2016-07-11 | Disposition: A | Discharge status: Home / Self Care | Payer: BLUE CROSS/BLUE SHIELD | Source: Ambulatory Visit | Attending: Student | Admitting: Student

## 2016-07-11 DIAGNOSIS — R140 Abdominal distension (gaseous): Secondary | ICD-10-CM | POA: Insufficient documentation

## 2016-07-11 DIAGNOSIS — R1084 Generalized abdominal pain: Secondary | ICD-10-CM | POA: Insufficient documentation

## 2016-07-11 MED ORDER — IOPAMIDOL (ISOVUE-300) INJECTION 61%
75.0000 mL | Freq: Once | INTRAVENOUS | Status: AC | PRN
  Administered 2016-07-11: 75 mL via INTRAVENOUS

## 2017-01-31 ENCOUNTER — Ambulatory Visit: Payer: BLUE CROSS/BLUE SHIELD | Attending: Family Medicine

## 2017-03-15 ENCOUNTER — Ambulatory Visit (INDEPENDENT_AMBULATORY_CARE_PROVIDER_SITE_OTHER): Payer: BLUE CROSS/BLUE SHIELD | Admitting: Obstetrics & Gynecology

## 2017-03-15 ENCOUNTER — Encounter: Payer: Self-pay | Admitting: Obstetrics & Gynecology

## 2017-03-15 VITALS — BP 98/60 | HR 107 | Ht 62.0 in | Wt 115.0 lb

## 2017-03-15 DIAGNOSIS — Z124 Encounter for screening for malignant neoplasm of cervix: Secondary | ICD-10-CM

## 2017-03-15 DIAGNOSIS — Z1239 Encounter for other screening for malignant neoplasm of breast: Secondary | ICD-10-CM

## 2017-03-15 DIAGNOSIS — Z1231 Encounter for screening mammogram for malignant neoplasm of breast: Secondary | ICD-10-CM

## 2017-03-15 DIAGNOSIS — Z0000 Encounter for general adult medical examination without abnormal findings: Secondary | ICD-10-CM | POA: Diagnosis not present

## 2017-03-15 DIAGNOSIS — N926 Irregular menstruation, unspecified: Secondary | ICD-10-CM | POA: Diagnosis not present

## 2017-03-15 NOTE — Progress Notes (Signed)
HPI:      Ms. Ronn MelenaBridget Renay Howard Poucherry Hollis is a 46 y.o. G1P1001 who LMP was Patient's last menstrual period was 02/01/2017., she presents today for her annual examination. The patient has no complaints today. The patient is sexually active. Years ago-  last pap: was normal  The patient has regular exercise: yes.  Mood and period changes- premenopausal, not enough to want treatment.  GYN History: Contraception: tubal ligation  PMHx: She  has a past medical history of Asthma in remission; Back pain, chronic; and Fibromyalgia. Also,  has no past surgical history on file., family history includes Bone cancer in her maternal uncle and paternal aunt; Breast cancer (age of onset: 1149) in her mother; Cancer in her maternal grandmother; Colon cancer in her maternal grandfather; Diabetes in her other; Pancreatic cancer in her maternal aunt.,  reports that she has been smoking.  She has never used smokeless tobacco. She reports that she does not drink alcohol or use drugs.  She has a current medication list which includes the following prescription(s): albuterol, co-enzyme q-10, albuterol, cyclobenzaprine, hydrocodone-acetaminophen, and methocarbamol. Also, is allergic to clarithromycin; codeine; and codeine.  Review of Systems  Constitutional: Negative for chills, fever and malaise/fatigue.  HENT: Negative for congestion, sinus pain and sore throat.   Eyes: Negative for blurred vision and pain.  Respiratory: Negative for cough and wheezing.   Cardiovascular: Negative for chest pain and leg swelling.  Gastrointestinal: Negative for abdominal pain, constipation, diarrhea, heartburn, nausea and vomiting.  Genitourinary: Negative for dysuria, frequency, hematuria and urgency.  Musculoskeletal: Negative for back pain, joint pain, myalgias and neck pain.  Skin: Negative for itching and rash.  Neurological: Negative for dizziness, tremors and weakness.  Endo/Heme/Allergies: Does not bruise/bleed easily.    Psychiatric/Behavioral: Negative for depression. The patient is not nervous/anxious and does not have insomnia.     Objective: BP 98/60   Pulse (!) 107   Ht 5\' 2"  (1.575 m)   Wt 115 lb (52.2 kg)   LMP 02/01/2017   BMI 21.03 kg/m  Physical Exam  Constitutional: She is oriented to person, place, and time. She appears well-developed and well-nourished. No distress.  Genitourinary: Rectum normal, vagina normal and uterus normal. Pelvic exam was performed with patient supine. There is no rash or lesion on the right labia. There is no rash or lesion on the left labia. Vagina exhibits no lesion. No bleeding in the vagina. Right adnexum does not display mass and does not display tenderness. Left adnexum does not display mass and does not display tenderness. Cervix does not exhibit motion tenderness, lesion, friability or polyp.   Uterus is mobile and midaxial. Uterus is not enlarged or exhibiting a mass.  HENT:  Head: Normocephalic and atraumatic. Head is without laceration.  Right Ear: Hearing normal.  Left Ear: Hearing normal.  Nose: No epistaxis.  No foreign bodies.  Mouth/Throat: Uvula is midline, oropharynx is clear and moist and mucous membranes are normal.  Eyes: Pupils are equal, round, and reactive to light.  Neck: Normal range of motion. Neck supple. No thyromegaly present.  Cardiovascular: Normal rate and regular rhythm.  Exam reveals no gallop and no friction rub.   No murmur heard. Pulmonary/Chest: Effort normal and breath sounds normal. No respiratory distress. She has no wheezes. Right breast exhibits no mass, no skin change and no tenderness. Left breast exhibits no mass, no skin change and no tenderness.  Abdominal: Soft. Bowel sounds are normal. She exhibits no distension. There is no tenderness.  There is no rebound.  Musculoskeletal: Normal range of motion.  Neurological: She is alert and oriented to person, place, and time. No cranial nerve deficit.  Skin: Skin is warm and  dry.  Psychiatric: She has a normal mood and affect. Judgment normal.  Vitals reviewed.   Assessment:  ANNUAL EXAM 1. Annual physical exam   2. Irregular menses   3. Screening for breast cancer   4. Screening for cervical cancer      Screening Plan:            1.  Cervical Screening-  Pap smear done today, every 3 years  2. Breast screening- Exam annually and mammogram>40 planned   3. Colonoscopy every 10 years, Hemoccult testing - after age 33  4. Labs per PCP or scheduled here, pt to see.  May want menopausal labs has low concerns for fertility. Reassured.  5. Counseling for contraception: bilateral tubal ligation  Other:  1. Annual physical exam  2. Irregular menses Monitor, premenopause  3. Screening for breast cancer - MM DIGITAL SCREENING BILATERAL; Future  4. Screening for cervical cancer - IGP, Aptima HPV      F/U  Return in about 1 year (around 03/15/2018) for Annual.  Annamarie Major, MD, [facility], [facility] 03/15/2017  11:05 AM

## 2017-03-18 LAB — IGP, APTIMA HPV
HPV APTIMA: NEGATIVE
PAP Smear Comment: 0

## 2017-04-03 ENCOUNTER — Other Ambulatory Visit: Payer: Self-pay | Admitting: Obstetrics & Gynecology

## 2017-04-03 DIAGNOSIS — Z1231 Encounter for screening mammogram for malignant neoplasm of breast: Secondary | ICD-10-CM

## 2017-04-05 ENCOUNTER — Ambulatory Visit: Payer: Self-pay

## 2017-04-07 ENCOUNTER — Ambulatory Visit: Payer: Self-pay

## 2017-12-26 ENCOUNTER — Other Ambulatory Visit: Payer: Self-pay | Admitting: Family Medicine

## 2017-12-26 DIAGNOSIS — Z1239 Encounter for other screening for malignant neoplasm of breast: Secondary | ICD-10-CM

## 2018-01-12 ENCOUNTER — Ambulatory Visit
Admission: RE | Admit: 2018-01-12 | Discharge: 2018-01-12 | Disposition: A | Discharge status: Home / Self Care | Payer: BLUE CROSS/BLUE SHIELD | Source: Ambulatory Visit | Attending: Family Medicine | Admitting: Family Medicine

## 2018-01-12 ENCOUNTER — Encounter: Payer: Self-pay | Admitting: Radiology

## 2018-01-12 DIAGNOSIS — Z1239 Encounter for other screening for malignant neoplasm of breast: Secondary | ICD-10-CM

## 2018-01-12 DIAGNOSIS — Z1231 Encounter for screening mammogram for malignant neoplasm of breast: Secondary | ICD-10-CM | POA: Insufficient documentation

## 2020-02-08 ENCOUNTER — Inpatient Hospital Stay (HOSPITAL_COMMUNITY)
Admission: EM | Admit: 2020-02-08 | Discharge: 2020-02-19 | DRG: 021 | Disposition: A | Discharge status: Home Health | Payer: PRIVATE HEALTH INSURANCE | Attending: Neurosurgery | Admitting: Neurosurgery

## 2020-02-08 ENCOUNTER — Emergency Department (HOSPITAL_COMMUNITY): Payer: PRIVATE HEALTH INSURANCE

## 2020-02-08 ENCOUNTER — Encounter (HOSPITAL_COMMUNITY): Payer: Self-pay

## 2020-02-08 ENCOUNTER — Other Ambulatory Visit: Payer: Self-pay

## 2020-02-08 ENCOUNTER — Inpatient Hospital Stay (HOSPITAL_COMMUNITY): Payer: PRIVATE HEALTH INSURANCE

## 2020-02-08 DIAGNOSIS — Z803 Family history of malignant neoplasm of breast: Secondary | ICD-10-CM | POA: Diagnosis not present

## 2020-02-08 DIAGNOSIS — I608 Other nontraumatic subarachnoid hemorrhage: Secondary | ICD-10-CM

## 2020-02-08 DIAGNOSIS — Z881 Allergy status to other antibiotic agents status: Secondary | ICD-10-CM

## 2020-02-08 DIAGNOSIS — K5900 Constipation, unspecified: Secondary | ICD-10-CM | POA: Diagnosis not present

## 2020-02-08 DIAGNOSIS — Z885 Allergy status to narcotic agent status: Secondary | ICD-10-CM

## 2020-02-08 DIAGNOSIS — Z20822 Contact with and (suspected) exposure to covid-19: Secondary | ICD-10-CM | POA: Diagnosis present

## 2020-02-08 DIAGNOSIS — G8929 Other chronic pain: Secondary | ICD-10-CM | POA: Diagnosis present

## 2020-02-08 DIAGNOSIS — R6889 Other general symptoms and signs: Secondary | ICD-10-CM

## 2020-02-08 DIAGNOSIS — I609 Nontraumatic subarachnoid hemorrhage, unspecified: Secondary | ICD-10-CM

## 2020-02-08 DIAGNOSIS — I671 Cerebral aneurysm, nonruptured: Secondary | ICD-10-CM

## 2020-02-08 DIAGNOSIS — M797 Fibromyalgia: Secondary | ICD-10-CM | POA: Diagnosis present

## 2020-02-08 DIAGNOSIS — F17200 Nicotine dependence, unspecified, uncomplicated: Secondary | ICD-10-CM | POA: Diagnosis present

## 2020-02-08 DIAGNOSIS — Z800 Family history of malignant neoplasm of digestive organs: Secondary | ICD-10-CM

## 2020-02-08 DIAGNOSIS — R0602 Shortness of breath: Secondary | ICD-10-CM

## 2020-02-08 DIAGNOSIS — M545 Low back pain: Secondary | ICD-10-CM | POA: Diagnosis present

## 2020-02-08 DIAGNOSIS — J811 Chronic pulmonary edema: Secondary | ICD-10-CM | POA: Diagnosis present

## 2020-02-08 DIAGNOSIS — I602 Nontraumatic subarachnoid hemorrhage from anterior communicating artery: Secondary | ICD-10-CM | POA: Diagnosis present

## 2020-02-08 LAB — RAPID URINE DRUG SCREEN, HOSP PERFORMED
Amphetamines: NOT DETECTED
Barbiturates: NOT DETECTED
Benzodiazepines: NOT DETECTED
Cocaine: NOT DETECTED
Opiates: NOT DETECTED
Tetrahydrocannabinol: POSITIVE — AB

## 2020-02-08 LAB — CBC WITH DIFFERENTIAL/PLATELET
Abs Immature Granulocytes: 0.03 10*3/uL (ref 0.00–0.07)
Basophils Absolute: 0.1 10*3/uL (ref 0.0–0.1)
Basophils Relative: 1 %
Eosinophils Absolute: 0.2 10*3/uL (ref 0.0–0.5)
Eosinophils Relative: 2 %
HCT: 42 % (ref 36.0–46.0)
Hemoglobin: 13.8 g/dL (ref 12.0–15.0)
Immature Granulocytes: 0 %
Lymphocytes Relative: 22 %
Lymphs Abs: 2.1 10*3/uL (ref 0.7–4.0)
MCH: 30.7 pg (ref 26.0–34.0)
MCHC: 32.9 g/dL (ref 30.0–36.0)
MCV: 93.3 fL (ref 80.0–100.0)
Monocytes Absolute: 0.6 10*3/uL (ref 0.1–1.0)
Monocytes Relative: 7 %
Neutro Abs: 6.3 10*3/uL (ref 1.7–7.7)
Neutrophils Relative %: 68 %
Platelets: 422 10*3/uL — ABNORMAL HIGH (ref 150–400)
RBC: 4.5 MIL/uL (ref 3.87–5.11)
RDW: 14.1 % (ref 11.5–15.5)
WBC: 9.2 10*3/uL (ref 4.0–10.5)
nRBC: 0 % (ref 0.0–0.2)

## 2020-02-08 LAB — CK TOTAL AND CKMB (NOT AT ARMC)
CK, MB: 3.6 ng/mL (ref 0.5–5.0)
Relative Index: INVALID (ref 0.0–2.5)
Total CK: 83 U/L (ref 38–234)

## 2020-02-08 LAB — URINALYSIS, ROUTINE W REFLEX MICROSCOPIC
Bacteria, UA: NONE SEEN
Bilirubin Urine: NEGATIVE
Glucose, UA: NEGATIVE mg/dL
Hgb urine dipstick: NEGATIVE
Ketones, ur: NEGATIVE mg/dL
Leukocytes,Ua: NEGATIVE
Nitrite: NEGATIVE
Protein, ur: 30 mg/dL — AB
Specific Gravity, Urine: 1.015 (ref 1.005–1.030)
pH: 7 (ref 5.0–8.0)

## 2020-02-08 LAB — COMPREHENSIVE METABOLIC PANEL
ALT: 21 U/L (ref 0–44)
AST: 24 U/L (ref 15–41)
Albumin: 3.9 g/dL (ref 3.5–5.0)
Alkaline Phosphatase: 84 U/L (ref 38–126)
Anion gap: 12 (ref 5–15)
BUN: 12 mg/dL (ref 6–20)
CO2: 21 mmol/L — ABNORMAL LOW (ref 22–32)
Calcium: 9.3 mg/dL (ref 8.9–10.3)
Chloride: 107 mmol/L (ref 98–111)
Creatinine, Ser: 0.77 mg/dL (ref 0.44–1.00)
GFR calc Af Amer: >60 mL/min (ref >60)
GFR calc non Af Amer: >60 mL/min (ref >60)
Glucose, Bld: 142 mg/dL — ABNORMAL HIGH (ref 70–99)
Potassium: 4 mmol/L (ref 3.5–5.1)
Sodium: 140 mmol/L (ref 135–145)
Total Bilirubin: 0.4 mg/dL (ref 0.3–1.2)
Total Protein: 6.5 g/dL (ref 6.5–8.1)

## 2020-02-08 LAB — I-STAT BETA HCG BLOOD, ED (MC, WL, AP ONLY): I-stat hCG, quantitative: <5 m[IU]/mL (ref <5)

## 2020-02-08 LAB — SURGICAL PCR SCREEN
MRSA, PCR: NEGATIVE
Staphylococcus aureus: POSITIVE — AB

## 2020-02-08 LAB — APTT: aPTT: 26 seconds (ref 24–36)

## 2020-02-08 LAB — RESPIRATORY PANEL BY RT PCR (FLU A&B, COVID)
Influenza A by PCR: NEGATIVE
Influenza B by PCR: NEGATIVE
SARS Coronavirus 2 by RT PCR: NEGATIVE

## 2020-02-08 LAB — CBG MONITORING, ED: Glucose-Capillary: 148 mg/dL — ABNORMAL HIGH (ref 70–99)

## 2020-02-08 LAB — PROTIME-INR
INR: 1 (ref 0.8–1.2)
Prothrombin Time: 13 seconds (ref 11.4–15.2)

## 2020-02-08 LAB — TROPONIN I (HIGH SENSITIVITY)
Troponin I (High Sensitivity): 10 ng/L (ref <18)
Troponin I (High Sensitivity): 15 ng/L (ref <18)

## 2020-02-08 LAB — LACTIC ACID, PLASMA: Lactic Acid, Venous: 3 mmol/L (ref 0.5–1.9)

## 2020-02-08 MED ORDER — SODIUM CHLORIDE 0.9 % IV SOLN: INTRAVENOUS | Status: DC

## 2020-02-08 MED ORDER — ACETAMINOPHEN 160 MG/5ML PO SOLN
650.0000 mg | ORAL | Status: DC | PRN
  Filled 2020-02-08: qty 20.3

## 2020-02-08 MED ORDER — METOCLOPRAMIDE HCL 5 MG/ML IJ SOLN
10.0000 mg | Freq: Once | INTRAMUSCULAR | Status: AC
  Administered 2020-02-08: 10 mg via INTRAVENOUS
  Filled 2020-02-08: qty 2

## 2020-02-08 MED ORDER — NIMODIPINE 30 MG PO CAPS
60.0000 mg | ORAL_CAPSULE | ORAL | Status: DC
  Administered 2020-02-08 – 2020-02-19 (×60): 60 mg via ORAL
  Filled 2020-02-08 (×63): qty 2

## 2020-02-08 MED ORDER — PANTOPRAZOLE SODIUM 40 MG PO PACK
40.0000 mg | PACK | Freq: Every day | ORAL | Status: DC
  Administered 2020-02-09: 40 mg
  Filled 2020-02-08 (×3): qty 20

## 2020-02-08 MED ORDER — PANTOPRAZOLE SODIUM 40 MG PO TBEC
40.0000 mg | DELAYED_RELEASE_TABLET | Freq: Every day | ORAL | Status: DC
  Administered 2020-02-08 – 2020-02-19 (×11): 40 mg via ORAL
  Filled 2020-02-08 (×12): qty 1

## 2020-02-08 MED ORDER — IOHEXOL 350 MG/ML SOLN
75.0000 mL | Freq: Once | INTRAVENOUS | Status: AC | PRN
  Administered 2020-02-08: 75 mL via INTRAVENOUS

## 2020-02-08 MED ORDER — ONDANSETRON 4 MG PO TBDP
4.0000 mg | ORAL_TABLET | Freq: Four times a day (QID) | ORAL | Status: DC | PRN
  Administered 2020-02-15: 4 mg via ORAL
  Filled 2020-02-08: qty 1

## 2020-02-08 MED ORDER — ALBUTEROL SULFATE (2.5 MG/3ML) 0.083% IN NEBU
2.5000 mg | INHALATION_SOLUTION | Freq: Four times a day (QID) | RESPIRATORY_TRACT | Status: DC | PRN
  Administered 2020-02-09 – 2020-02-12 (×8): 2.5 mg via RESPIRATORY_TRACT
  Filled 2020-02-08 (×9): qty 3

## 2020-02-08 MED ORDER — ALBUTEROL SULFATE HFA 108 (90 BASE) MCG/ACT IN AERS
2.0000 | INHALATION_SPRAY | Freq: Four times a day (QID) | RESPIRATORY_TRACT | Status: DC | PRN
  Filled 2020-02-08: qty 6.7

## 2020-02-08 MED ORDER — ACETAMINOPHEN 325 MG PO TABS
650.0000 mg | ORAL_TABLET | ORAL | Status: DC | PRN
  Administered 2020-02-08 – 2020-02-18 (×21): 650 mg via ORAL
  Filled 2020-02-08 (×23): qty 2

## 2020-02-08 MED ORDER — ACETAMINOPHEN 650 MG RE SUPP: 650.0000 mg | RECTAL | Status: DC | PRN

## 2020-02-08 MED ORDER — DOCUSATE SODIUM 100 MG PO CAPS
100.0000 mg | ORAL_CAPSULE | Freq: Two times a day (BID) | ORAL | Status: DC
  Administered 2020-02-09 – 2020-02-19 (×16): 100 mg via ORAL
  Filled 2020-02-08 (×20): qty 1

## 2020-02-08 MED ORDER — ONDANSETRON HCL 4 MG/2ML IJ SOLN
4.0000 mg | Freq: Four times a day (QID) | INTRAMUSCULAR | Status: DC | PRN
  Administered 2020-02-09 – 2020-02-17 (×6): 4 mg via INTRAVENOUS
  Filled 2020-02-08 (×9): qty 2

## 2020-02-08 MED ORDER — DIPHENHYDRAMINE HCL 50 MG/ML IJ SOLN
12.5000 mg | Freq: Once | INTRAMUSCULAR | Status: AC
  Administered 2020-02-08: 12.5 mg via INTRAVENOUS
  Filled 2020-02-08: qty 1

## 2020-02-08 MED ORDER — CHLORHEXIDINE GLUCONATE CLOTH 2 % EX PADS
6.0000 | MEDICATED_PAD | Freq: Every day | CUTANEOUS | Status: DC
  Administered 2020-02-08 – 2020-02-19 (×9): 6 via TOPICAL

## 2020-02-08 MED ORDER — STROKE: EARLY STAGES OF RECOVERY BOOK
Freq: Once | Status: AC
  Filled 2020-02-08: qty 1

## 2020-02-08 MED ORDER — HYDROMORPHONE HCL 1 MG/ML IJ SOLN
0.5000 mg | INTRAMUSCULAR | Status: DC | PRN
  Administered 2020-02-08 – 2020-02-12 (×27): 0.5 mg via INTRAVENOUS
  Filled 2020-02-08 (×29): qty 1

## 2020-02-08 MED ORDER — NIMODIPINE 6 MG/ML PO SOLN
60.0000 mg | ORAL | Status: DC
  Administered 2020-02-10 – 2020-02-11 (×3): 60 mg
  Filled 2020-02-08 (×7): qty 10

## 2020-02-08 NOTE — Progress Notes (Signed)
    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 Dr. Conchita Paris of Neurosurgery was consulted. He will be scheduling the patient for a coiling of this aneurysm, likely on 02/09/2020.  Val Eagle, DNP, AGNP-C Nurse Practitioner  [facility] [address], [address], [place], [place] [address] P: [phone]    F: [phone]

## 2020-02-08 NOTE — ED Notes (Signed)
Called husband, Onalee Hua, with update.

## 2020-02-08 NOTE — Plan of Care (Signed)
  Problem: Education: Goal: Knowledge of General Education information will improve Description: Including pain rating scale, medication(s)/side effects and non-pharmacologic comfort measures Outcome: Not Progressing   Problem: Health Behavior/Discharge Planning: Goal: Ability to manage health-related needs will improve Outcome: Not Progressing   

## 2020-02-08 NOTE — Progress Notes (Signed)
Doran Durand, NP made aware that patient's HR is 42-53 and irregular. EKG ordered.

## 2020-02-08 NOTE — ED Provider Notes (Signed)
[facility] EMERGENCY DEPARTMENT Provider Note   CSN: [id number] Arrival date & time: 02/08/20  1216     History Chief Complaint  Patient presents with  . Headache    Sabrina [name] Howard Pouch is a 49 y.o. female with a past medical history of fibromyalgia presenting to the ED with a chief complaint of headache.  States that she has had intermittent headaches with no specific aggravating or alleviating factor for the past month.  She cannot recall any trigger that caused these headaches.  Woke up this morning with worsening of her headache and called for help because she was unable to get out of bed.  States that the headache is located throughout her entire head and does not feel like a migraine.  She has not seen a headache specialist in the past.  She denies any nausea, vomiting, injuries or falls, numbness in arms or legs, chest pain or shortness of breath.  She has been taking Aleve intermittently with minimal improvement in her headache.  Denies any other daily medications.  Denies any neck stiffness.  States that she is currently living by herself for the past few days as her husband is in [place] for work.  Reports occasional marijuana use but denies any drug use or alcohol use.  Denies history of seizures. No sick contacts with similar symptoms.  HPI     Past Medical History:  Diagnosis Date  . Asthma in remission   . Back pain, chronic   . Fibromyalgia     Patient Active Problem List   Diagnosis Date Noted  . Subarachnoid hemorrhage (HCC) 02/08/2020  . Irregular menses 03/15/2017    Past Surgical History:  Procedure Laterality Date  . BREAST BIOPSY Right      OB History    Gravida  1   Para  1   Term  1   Preterm  0   AB  0   Living  1     SAB  0   TAB  0   Ectopic  0   Multiple      Live Births              Family History  Problem Relation Age of Onset  . Diabetes Other   . Hypertension Unknown   . Breast cancer  Mother 12  . Pancreatic cancer Maternal Aunt   . Bone cancer Maternal Uncle   . Cancer Maternal Grandmother   . Breast cancer Maternal Grandmother 50  . Colon cancer Maternal Grandfather   . Bone cancer Paternal Aunt     Social History   Tobacco Use  . Smoking status: Current Every Day Smoker  . Smokeless tobacco: Never Used  Substance Use Topics  . Alcohol use: No  . Drug use: No    Home Medications Prior to Admission medications   Medication Sig Start Date End Date Taking? Authorizing Provider  albuterol (PROVENTIL HFA;VENTOLIN HFA) 108 (90 BASE) MCG/ACT inhaler Inhale 2 puffs into the lungs every 6 (six) hours as needed for wheezing or shortness of breath.    [provider]  ALBUTEROL IN Inhale 2 puffs into the lungs 2 (two) times daily as needed.      [provider]  co-enzyme Q-10 30 MG capsule Take 30 mg by mouth 3 (three) times daily.    [provider]  cyclobenzaprine (FLEXERIL) 5 MG tablet Take 5 mg by mouth 3 (three) times daily as needed for muscle spasms.  [provider]  HYDROcodone-acetaminophen (NORCO/VICODIN) 5-325 MG tablet Take 1 tablet by mouth every 4 (four) hours as needed. Patient not taking: Reported on 03/15/2017 06/17/16   Minna Antis, MD  methocarbamol (ROBAXIN) 500 MG tablet Take 1 tablet (500 mg total) by mouth 2 (two) times daily. Patient not taking: Reported on 03/15/2017 02/27/14   Cristobal Goldmann, PA-C    Allergies    Clarithromycin, Codeine, and Codeine  Review of Systems   Review of Systems  Constitutional: Negative for appetite change, chills and fever.  HENT: Negative for ear pain, rhinorrhea, sneezing and sore throat.   Eyes: Negative for photophobia and visual disturbance.  Respiratory: Negative for cough, chest tightness, shortness of breath and wheezing.   Cardiovascular: Negative for chest pain and palpitations.  Gastrointestinal: Negative for abdominal pain, blood in stool, constipation,  diarrhea, nausea and vomiting.  Genitourinary: Negative for dysuria, hematuria and urgency.  Musculoskeletal: Negative for myalgias.  Skin: Negative for rash.  Neurological: Positive for headaches. Negative for dizziness, weakness and light-headedness.    Physical Exam Updated Vital Signs BP 119/89 (BP Location: Right Arm)   Pulse 72   Temp (!) 95 F (35 C) (Rectal)   Resp 19   SpO2 100%   Physical Exam Vitals and nursing note reviewed.  Constitutional:      General: She is not in acute distress.    Appearance: She is well-developed.  HENT:     Head: Normocephalic and atraumatic.     Nose: Nose normal.  Eyes:     General: No scleral icterus.       Left eye: No discharge.     Conjunctiva/sclera: Conjunctivae normal.  Neck:     Comments: No meningeal signs. Cardiovascular:     Rate and Rhythm: Normal rate and regular rhythm.     Heart sounds: Normal heart sounds. No murmur. No friction rub. No gallop.   Pulmonary:     Effort: Pulmonary effort is normal. No respiratory distress.     Breath sounds: Normal breath sounds.  Abdominal:     General: Bowel sounds are normal. There is no distension.     Palpations: Abdomen is soft.     Tenderness: There is no abdominal tenderness. There is no guarding.  Musculoskeletal:        General: Normal range of motion.     Cervical back: Normal range of motion and neck supple.  Skin:    General: Skin is warm and dry.     Findings: No rash.  Neurological:     Mental Status: She is alert.     Sensory: No sensory deficit.     Motor: No weakness or abnormal muscle tone.     Coordination: Coordination normal.     Comments: Alert, oriented to self, place, situation.  Believes it is January 2021.  Knows the current president is Jackquline Bosch but does not believe "that he is the right president."  No facial asymmetry noted. Pupils reactive. Cranial nerves appear grossly intact. Sensation intact to light touch on face, BUE and BLE. Strength 5/5 in BUE  and BLE.      ED Results / Procedures / Treatments   Labs (all labs ordered are listed, but only abnormal results are displayed) Labs Reviewed  COMPREHENSIVE METABOLIC PANEL - Abnormal; Notable for the following components:      Result Value   CO2 21 (*)    Glucose, Bld 142 (*)    All other components within normal limits  CBC WITH DIFFERENTIAL/PLATELET -  Abnormal; Notable for the following components:   Platelets 422 (*)    All other components within normal limits  LACTIC ACID, PLASMA - Abnormal; Notable for the following components:   Lactic Acid, Venous 3.0 (*)    All other components within normal limits  RAPID URINE DRUG SCREEN, HOSP PERFORMED - Abnormal; Notable for the following components:   Tetrahydrocannabinol POSITIVE (*)    All other components within normal limits  URINALYSIS, ROUTINE W REFLEX MICROSCOPIC - Abnormal; Notable for the following components:   APPearance CLOUDY (*)    Protein, ur 30 (*)    All other components within normal limits  CBG MONITORING, ED - Abnormal; Notable for the following components:   Glucose-Capillary 148 (*)    All other components within normal limits  URINE CULTURE  CULTURE, BLOOD (ROUTINE X 2)  CULTURE, BLOOD (ROUTINE X 2)  RESPIRATORY PANEL BY RT PCR (FLU A&B, COVID)  PROTIME-INR  APTT  LACTIC ACID, PLASMA  I-STAT BETA HCG BLOOD, ED ([facility], [facility], [facility] ONLY)  TROPONIN I (HIGH SENSITIVITY)  TROPONIN I (HIGH SENSITIVITY)    EKG EKG Interpretation  Date/Time:  Saturday February 08 2020 12:18:38 EST Ventricular Rate:  70 PR Interval:    QRS Duration: 86 QT Interval:  444 QTC Calculation: 480 R Axis:   85 Text Interpretation: Sinus rhythm Borderline short PR interval ST more elevated in Confirmed by Vanetta Mulders [phone]) on 02/08/2020 12:43:43 PM   Radiology CT Head Wo Contrast  Result Date: 02/08/2020 CLINICAL DATA:  Intermittent headache for a month. Today pt couldn't get out of the bed, was nausea, and her headache  worsened. Denies any injury EXAM: CT HEAD WITHOUT CONTRAST TECHNIQUE: Contiguous axial images were obtained from the base of the skull through the vertex without intravenous contrast. COMPARISON:  01/01/2010 FINDINGS: Brain: Positive for acute subarachnoid hemorrhage most conspicuous medial to the left frontal lobe extending bilaterally and into basilar cisterns. No hydrocephalus or midline shift. No focal parenchymal edema or mass. Cavum septum pellucidum. Vascular: No hyperdense vessel or unexpected calcification. Skull: Normal. Negative for fracture or focal lesion. Sinuses/Orbits: Orbits unremarkable. Debris in bilateral maxillary sinuses. Other: None IMPRESSION: Positive for acute subarachnoid hemorrhage, without hydrocephalus or midline shift. Critical Value/emergent results were called by telephone at the time of interpretation on 02/08/2020 at 1:20 pm to provider [facility] , who verbally acknowledged these results. Electronically Signed   By: Corlis Leak M.D.   On: 02/08/2020 13:20    Procedures .Critical Care Performed by: Dietrich Pates, PA-C Authorized by: Dietrich Pates, PA-C   Critical care provider statement:    Critical care time (minutes):  35   Critical care time was exclusive of:  Separately billable procedures and treating other patients   Critical care was necessary to treat or prevent imminent or life-threatening deterioration of the following conditions:  CNS failure or compromise, circulatory failure, cardiac failure and trauma   Critical care was time spent personally by me on the following activities:  Development of treatment plan with patient or surrogate, discussions with consultants, evaluation of patient's response to treatment, examination of patient, ordering and performing treatments and interventions, ordering and review of laboratory studies, ordering and review of radiographic studies, re-evaluation of patient's condition and review of old charts   I assumed direction of  critical care for this patient from another provider in my specialty: no     (including critical care time)  Medications Ordered in ED Medications  metoCLOPramide (REGLAN) injection 10 mg (10 mg  Intravenous Given 02/08/20 1337)  diphenhydrAMINE (BENADRYL) injection 12.5 mg (12.5 mg Intravenous Given 02/08/20 1337)    ED Course  I have reviewed the triage vital signs and the nursing notes.  Pertinent labs & imaging results that were available during my care of the patient were reviewed by me and considered in my medical decision making (see chart for details).    MDM Rules/Calculators/A&P                      49 year old female presenting to the ED with a chief complaint of headache.  Has had intermittent headaches for the past month but worsened this morning and could not get out of bed so she called for help.  Patient was found incontinent in her bed.  She cannot recall any injury or falls recently.  She has been taking Aleve with minimal improvement in her headache.  On exam patient is alert and oriented x3.  No deficits neurological exam noted.  She is still complaining of a headache.  Vital signs within normal limits except for temperature rectally of 95.  She was placed on a bear hugger.  Work appears significant for lactic acid of 3, UDS positive for THC, normal CBG, CBC, CMP.  CT of the head shows subarachnoid hemorrhage of the left frontal area.  I have personally reviewed and interpreted of all imaging and lab work.  I feel that her elevated lactic acid and and episode of incontinence could be due to a seizure 2/2 SAH. Neurosurgery, Dr. Annette Stable recommends CTA head and neck which has been ordered.  He will evaluate the patient at the bedside and admit.  Patient given migraine cocktail.  Final Clinical Impression(s) / ED Diagnoses Final diagnoses:  Subarachnoid hemorrhage ([place])    Rx / DC Orders ED Discharge Orders    None     Portions of this note were generated with Dragon  dictation software. Dictation errors may occur despite best attempts at proofreading.    Delia Heady, PA-C 02/08/20 1421    Fredia Sorrow, MD 02/08/20 1534

## 2020-02-08 NOTE — ED Notes (Signed)
Pt refusing bair hugger blanket, states "it is too hot".

## 2020-02-08 NOTE — Progress Notes (Addendum)
Pt had an episode of emesis. Made neurosurgery aware. Verbal order to continue monitoring pt's neuro status and notify MD of decline.

## 2020-02-08 NOTE — ED Triage Notes (Signed)
To room via EMS.  Pt called neighbor this morning because she had headache, nausea and couldn't get out of bed.  Neighbor came over to find pt in the bed and called EMS.  Pt incontinent.  Pt was somewhat combative and repetitive talking.  Was able to say name and DOB.  Pt states she has had headache off and on x 1 month.   EMS BP 135/86 HR 72 SpO2 99% on room air.  CBG 196 EMS gave Zofran 4mg .  No c/o nausea at this time.  Denies any head injury.  Has not seen doctor for  Headaches.

## 2020-02-08 NOTE — ED Provider Notes (Signed)
Medical screening examination/treatment/procedure(s) were conducted as a shared visit with non-physician practitioner(s) and myself.  I personally evaluated the patient during the encounter.  EKG Interpretation  Date/Time:  Saturday February 08 2020 12:18:38 EST Ventricular Rate:  70 PR Interval:    QRS Duration: 86 QT Interval:  444 QTC Calculation: 480 R Axis:   85 Text Interpretation: Sinus rhythm Borderline short PR interval ST more elevated in Confirmed by Vanetta Mulders [phone]) on 02/08/2020 12:43:43 PM   Patient seen by me along with PA. Head CT c/w subarachnoid hem, and likely secondary to anuresym. DW neurosurgery Dr. Jordan Likes and he wil see patient will get CTA head and neck. Blood pressure well controlled   Vanetta Mulders, MD 02/08/20 [phone]

## 2020-02-08 NOTE — H&P (Signed)
Sabrina [name] is an 49 y.o. female.   Chief Complaint: Severe headache HPI:  Patient with a history of back pain and fibromyalgia. She reports she has been getting intermittent headaches over the past month. She does not recall any precipitating events leading up to this headache. She woke up with this severe headache and was unable to get out of bed. She contacted her neighbor, who called EMS. The patient describes the headache as generalized. She denies photophobia or blurred vision. She does have nausea, but denies any episodes of emesis. She denies numbness or weakness of her extremities. She denies changes in her speech or facial droop.  Past Medical History:  Diagnosis Date  . Asthma in remission   . Back pain, chronic   . Fibromyalgia     Past Surgical History:  Procedure Laterality Date  . BREAST BIOPSY Right     Family History  Problem Relation Age of Onset  . Diabetes Other   . Hypertension Unknown   . Breast cancer Mother 11  . Pancreatic cancer Maternal Aunt   . Bone cancer Maternal Uncle   . Cancer Maternal Grandmother   . Breast cancer Maternal Grandmother 50  . Colon cancer Maternal Grandfather   . Bone cancer Paternal Aunt    Social History:  reports that she has been smoking. She has never used smokeless tobacco. She reports that she does not drink alcohol or use drugs.  Allergies:  Allergies  Allergen Reactions  . Clarithromycin Nausea And Vomiting  . Codeine Hives and Nausea And Vomiting  . Codeine Rash and Other (See Comments)    Abdominal pain    (Not in a hospital admission)   Results for orders placed or performed during the hospital encounter of 02/08/20 (from the past 48 hour(s))  POC CBG, ED     Status: Abnormal   Collection Time: 02/08/20 12:42 PM  Result Value Ref Range   Glucose-Capillary 148 (H) 70 - 99 mg/dL  Urine rapid drug screen (hosp performed)     Status: Abnormal   Collection Time: 02/08/20 12:45 PM  Result Value Ref  Range   Opiates NONE DETECTED NONE DETECTED   Cocaine NONE DETECTED NONE DETECTED   Benzodiazepines NONE DETECTED NONE DETECTED   Amphetamines NONE DETECTED NONE DETECTED   Tetrahydrocannabinol POSITIVE (A) NONE DETECTED   Barbiturates NONE DETECTED NONE DETECTED    Comment: (NOTE) DRUG SCREEN FOR MEDICAL PURPOSES ONLY.  IF CONFIRMATION IS NEEDED FOR ANY PURPOSE, NOTIFY LAB WITHIN 5 DAYS. LOWEST DETECTABLE LIMITS FOR URINE DRUG SCREEN Drug Class                     Cutoff (ng/mL) Amphetamine and metabolites    1000 Barbiturate and metabolites    200 Benzodiazepine                 200 Tricyclics and metabolites     300 Opiates and metabolites        300 Cocaine and metabolites        300 THC                            50 Performed at [facility], [address] [place], [place] [address]   Urinalysis, Routine w reflex microscopic     Status: Abnormal   Collection Time: 02/08/20 12:45 PM  Result Value Ref Range   Color, Urine YELLOW YELLOW   APPearance  CLOUDY (A) CLEAR   Specific Gravity, Urine 1.015 1.005 - 1.030   pH 7.0 5.0 - 8.0   Glucose, UA NEGATIVE NEGATIVE mg/dL   Hgb urine dipstick NEGATIVE NEGATIVE   Bilirubin Urine NEGATIVE NEGATIVE   Ketones, ur NEGATIVE NEGATIVE mg/dL   Protein, ur 30 (A) NEGATIVE mg/dL   Nitrite NEGATIVE NEGATIVE   Leukocytes,Ua NEGATIVE NEGATIVE   Bacteria, UA NONE SEEN NONE SEEN   Squamous Epithelial / LPF 0-5 0 - 5   Mucus PRESENT    Hyaline Casts, UA PRESENT     Comment: Performed at [facility], [address], [place], [place] [address]  Lactic acid, plasma     Status: Abnormal   Collection Time: 02/08/20 12:48 PM  Result Value Ref Range   Lactic Acid, Venous 3.0 (HH) 0.5 - 1.9 mmol/L    Comment: CRITICAL RESULT CALLED TO, READ BACK BY AND VERIFIED WITH: RN C PRICE AT 1343 02/08/20 BY L BENFIELD Performed at [facility], [address], [place], [place] [address]   Comprehensive metabolic panel     Status:  Abnormal   Collection Time: 02/08/20 12:55 PM  Result Value Ref Range   Sodium 140 135 - 145 mmol/L   Potassium 4.0 3.5 - 5.1 mmol/L   Chloride 107 98 - 111 mmol/L   CO2 21 (L) 22 - 32 mmol/L   Glucose, Bld 142 (H) 70 - 99 mg/dL   BUN 12 6 - 20 mg/dL   Creatinine, Ser 9.79 0.44 - 1.00 mg/dL   Calcium 9.3 8.9 - 89.2 mg/dL   Total Protein 6.5 6.5 - 8.1 g/dL   Albumin 3.9 3.5 - 5.0 g/dL   AST 24 15 - 41 U/L   ALT 21 0 - 44 U/L   Alkaline Phosphatase 84 38 - 126 U/L   Total Bilirubin 0.4 0.3 - 1.2 mg/dL   GFR calc non Af Amer >60 >60 mL/min   GFR calc Af Amer >60 >60 mL/min   Anion gap 12 5 - 15    Comment: Performed at [facility], [address], [place], [place] [address]  CBC with Differential     Status: Abnormal   Collection Time: 02/08/20 12:55 PM  Result Value Ref Range   WBC 9.2 4.0 - 10.5 K/uL   RBC 4.50 3.87 - 5.11 MIL/uL   Hemoglobin 13.8 12.0 - 15.0 g/dL   HCT 74.0 81.4 - 48.1 %   MCV 93.3 80.0 - 100.0 fL   MCH 30.7 26.0 - 34.0 pg   MCHC 32.9 30.0 - 36.0 g/dL   RDW 85.6 31.4 - 97.0 %   Platelets 422 (H) 150 - 400 K/uL   nRBC 0.0 0.0 - 0.2 %   Neutrophils Relative % 68 %   Neutro Abs 6.3 1.7 - 7.7 K/uL   Lymphocytes Relative 22 %   Lymphs Abs 2.1 0.7 - 4.0 K/uL   Monocytes Relative 7 %   Monocytes Absolute 0.6 0.1 - 1.0 K/uL   Eosinophils Relative 2 %   Eosinophils Absolute 0.2 0.0 - 0.5 K/uL   Basophils Relative 1 %   Basophils Absolute 0.1 0.0 - 0.1 K/uL   Immature Granulocytes 0 %   Abs Immature Granulocytes 0.03 0.00 - 0.07 K/uL    Comment: Performed at [facility] Lab, [address], [place], [place] [address]  Troponin I (High Sensitivity)     Status: None   Collection Time: 02/08/20 12:55 PM  Result Value  Ref Range   Troponin I (High Sensitivity) 10 <18 ng/L    Comment: (NOTE) Elevated high sensitivity troponin I (hsTnI) values and significant  changes across serial measurements may suggest ACS but many other  chronic and acute  conditions are known to elevate hsTnI results.  Refer to the "Links" section for chest pain algorithms and additional  guidance. Performed at [facility], [address] [place], [place] [address]   Protime-INR     Status: None   Collection Time: 02/08/20 12:55 PM  Result Value Ref Range   Prothrombin Time 13.0 11.4 - 15.2 seconds   INR 1.0 0.8 - 1.2    Comment: (NOTE) INR goal varies based on device and disease states. Performed at [facility] Lab, [address], [place], [place] [address]   APTT     Status: None   Collection Time: 02/08/20 12:55 PM  Result Value Ref Range   aPTT 26 24 - 36 seconds    Comment: Performed at [facility] Lab, [address], [place], [place] [address]  I-Stat beta hCG blood, ED     Status: None   Collection Time: 02/08/20 12:57 PM  Result Value Ref Range   I-stat hCG, quantitative <5.0 <5 mIU/mL   Comment 3            Comment:   GEST. AGE      CONC.  (mIU/mL)   <=1 WEEK        5 - 50     2 WEEKS       50 - 500     3 WEEKS       100 - 10,000     4 WEEKS     1,000 - 30,000        FEMALE AND NON-PREGNANT FEMALE:     LESS THAN 5 mIU/mL    CT Head Wo Contrast  Result Date: 02/08/2020 CLINICAL DATA:  Intermittent headache for a month. Today pt couldn't get out of the bed, was nausea, and her headache worsened. Denies any injury EXAM: CT HEAD WITHOUT CONTRAST TECHNIQUE: Contiguous axial images were obtained from the base of the skull through the vertex without intravenous contrast. COMPARISON:  01/01/2010 FINDINGS: Brain: Positive for acute subarachnoid hemorrhage most conspicuous medial to the left frontal lobe extending bilaterally and into basilar cisterns. No hydrocephalus or midline shift. No focal parenchymal edema or mass. Cavum septum pellucidum. Vascular: No hyperdense vessel or unexpected calcification. Skull: Normal. Negative for fracture or focal lesion. Sinuses/Orbits: Orbits unremarkable. Debris in bilateral maxillary sinuses.  Other: None IMPRESSION: Positive for acute subarachnoid hemorrhage, without hydrocephalus or midline shift. Critical Value/emergent results were called by telephone at the time of interpretation on 02/08/2020 at 1:20 pm to provider [facility] , who verbally acknowledged these results. Electronically Signed   By: Corlis Leak M.D.   On: 02/08/2020 13:20    Review of Systems  Constitutional: Positive for activity change and appetite change. Negative for chills and fever.  HENT: Negative.   Eyes: Negative.   Respiratory: Negative for cough, chest tightness and shortness of breath.   Cardiovascular: Negative for chest pain and palpitations.  Gastrointestinal: Positive for nausea. Negative for abdominal distention, abdominal pain, diarrhea and vomiting.  Endocrine: Negative.   Genitourinary: Negative.   Musculoskeletal: Positive for back pain, neck pain and neck stiffness.  Skin: Negative.   Allergic/Immunologic: Negative.   Neurological: Positive for dizziness and headaches. Negative for seizures, syncope, facial asymmetry and speech  difficulty.  Hematological: Negative.   Psychiatric/Behavioral: Negative.     Blood pressure 119/89, pulse 72, temperature (!) 95 F (35 C), temperature source Rectal, resp. rate 19, SpO2 100 %. Physical Exam  Constitutional: She is oriented to person, place, and time. She appears well-developed. She appears lethargic.  HENT:  Head: Normocephalic and atraumatic.  Eyes: Pupils are equal, round, and reactive to light. Conjunctivae and EOM are normal.  Neck: Trachea normal.  Neck pain with movement  Cardiovascular: Normal rate and regular rhythm.  Respiratory: Effort normal. No respiratory distress.  GI: Soft. She exhibits no distension. There is no abdominal tenderness.  Musculoskeletal:        General: Normal range of motion.     Cervical back: Rigidity present.  Neurological: She is oriented to person, place, and time. She has normal strength. She appears  lethargic. No cranial nerve deficit or sensory deficit. Coordination normal. GCS eye subscore is 4. GCS verbal subscore is 5. GCS motor subscore is 6.  Skin: Skin is warm, dry and intact.  Psychiatric: She has a normal mood and affect. Her speech is normal.     Assessment/Plan Patient with intermittent headaches for one month. This morning with worst headache of her life. CT scan revealed an acute subarachnoid hemorrhage. Patient to get CTA shortly to see if there is a vascular source for her subarachnoid hemorrhage. Patient started on Nimotop per protocol. She will be admitted to the ICU for close observation.  Patricia Nettle, NP 02/08/2020, 2:21 PM

## 2020-02-09 ENCOUNTER — Inpatient Hospital Stay (HOSPITAL_COMMUNITY): Payer: PRIVATE HEALTH INSURANCE | Admitting: Anesthesiology

## 2020-02-09 ENCOUNTER — Encounter (HOSPITAL_COMMUNITY): Payer: Self-pay | Admitting: Neurosurgery

## 2020-02-09 ENCOUNTER — Inpatient Hospital Stay (HOSPITAL_COMMUNITY): Payer: PRIVATE HEALTH INSURANCE

## 2020-02-09 ENCOUNTER — Encounter (HOSPITAL_COMMUNITY)
Admission: EM | Disposition: A | Discharge status: Home Health | Payer: Self-pay | Source: Home / Self Care | Attending: Neurosurgery

## 2020-02-09 HISTORY — PX: IR TRANSCATH/EMBOLIZ: IMG695

## 2020-02-09 HISTORY — PX: IR ANGIO INTRA EXTRACRAN SEL INTERNAL CAROTID BILAT MOD SED: IMG5363

## 2020-02-09 HISTORY — PX: IR ANGIOGRAM FOLLOW UP STUDY: IMG697

## 2020-02-09 HISTORY — PX: IR ANGIO VERTEBRAL SEL VERTEBRAL BILAT MOD SED: IMG5369

## 2020-02-09 HISTORY — PX: IR NEURO EACH ADD'L AFTER BASIC UNI RIGHT (MS): IMG5374

## 2020-02-09 HISTORY — PX: RADIOLOGY WITH ANESTHESIA: SHX6223

## 2020-02-09 LAB — URINE CULTURE: Culture: <10000 — AB

## 2020-02-09 LAB — TYPE AND SCREEN
ABO/RH(D): O POS
Antibody Screen: NEGATIVE

## 2020-02-09 LAB — HIV ANTIBODY (ROUTINE TESTING W REFLEX): HIV Screen 4th Generation wRfx: NONREACTIVE — AB

## 2020-02-09 LAB — ABO/RH: ABO/RH(D): O POS

## 2020-02-09 SURGERY — RADIOLOGY WITH ANESTHESIA
Anesthesia: General

## 2020-02-09 MED ORDER — SODIUM CHLORIDE 0.9 % IV SOLN: INTRAVENOUS | Status: DC | PRN

## 2020-02-09 MED ORDER — IOHEXOL 300 MG/ML  SOLN
150.0000 mL | Freq: Once | INTRAMUSCULAR | Status: AC | PRN
  Administered 2020-02-09: 20 mL via INTRA_ARTERIAL

## 2020-02-09 MED ORDER — LACTATED RINGERS IV SOLN: INTRAVENOUS | Status: DC | PRN

## 2020-02-09 MED ORDER — MIDAZOLAM HCL 2 MG/2ML IJ SOLN
INTRAMUSCULAR | Status: DC | PRN
  Administered 2020-02-09: 2 mg via INTRAVENOUS

## 2020-02-09 MED ORDER — PROPOFOL 10 MG/ML IV BOLUS
INTRAVENOUS | Status: DC | PRN
  Administered 2020-02-09: 100 mg via INTRAVENOUS

## 2020-02-09 MED ORDER — SODIUM CHLORIDE 0.9 % IV SOLN: INTRAVENOUS | Status: DC

## 2020-02-09 MED ORDER — LIDOCAINE 2% (20 MG/ML) 5 ML SYRINGE
INTRAMUSCULAR | Status: DC | PRN
  Administered 2020-02-09: 40 mg via INTRAVENOUS

## 2020-02-09 MED ORDER — ROCURONIUM BROMIDE 10 MG/ML (PF) SYRINGE
PREFILLED_SYRINGE | INTRAVENOUS | Status: DC | PRN
  Administered 2020-02-09: 20 mg via INTRAVENOUS
  Administered 2020-02-09: 50 mg via INTRAVENOUS

## 2020-02-09 MED ORDER — SUGAMMADEX SODIUM 200 MG/2ML IV SOLN
INTRAVENOUS | Status: DC | PRN
  Administered 2020-02-09: 150 mg via INTRAVENOUS

## 2020-02-09 MED ORDER — CLEVIDIPINE BUTYRATE 0.5 MG/ML IV EMUL
INTRAVENOUS | Status: AC
  Filled 2020-02-09: qty 50

## 2020-02-09 MED ORDER — MIDAZOLAM HCL 2 MG/2ML IJ SOLN
INTRAMUSCULAR | Status: AC
Start: 2020-02-09 — End: 2020-02-09
  Filled 2020-02-09: qty 2

## 2020-02-09 MED ORDER — ONDANSETRON HCL 4 MG/2ML IJ SOLN
INTRAMUSCULAR | Status: DC | PRN
  Administered 2020-02-09: 4 mg via INTRAVENOUS

## 2020-02-09 MED ORDER — FENTANYL CITRATE (PF) 250 MCG/5ML IJ SOLN
INTRAMUSCULAR | Status: DC | PRN
  Administered 2020-02-09 (×5): 50 ug via INTRAVENOUS

## 2020-02-09 MED ORDER — IOHEXOL 300 MG/ML  SOLN
50.0000 mL | Freq: Once | INTRAMUSCULAR | Status: AC | PRN
  Administered 2020-02-09: 40 mL via INTRA_ARTERIAL

## 2020-02-09 MED ORDER — FENTANYL CITRATE (PF) 250 MCG/5ML IJ SOLN
INTRAMUSCULAR | Status: AC
  Filled 2020-02-09: qty 5

## 2020-02-09 NOTE — Sedation Documentation (Signed)
SB[facility] called to [place], Charity fundraiser. All questions answered to satisfaction. Pt to be extubated and taken of PACU. PACU aware.

## 2020-02-09 NOTE — Progress Notes (Signed)
  NEUROSURGERY PROGRESS NOTE   Admission history reviewed with pt and in EMR. No issues overnight. Pt cont to c/o HA, no associated visual changes, new N/T/W.  Of note, no medical hx of HTN, ~1ppd smoking hx, no known FHx of aneurysms/hemorrhage  EXAM:  BP 119/62   Pulse 73   Temp 98.1 F (36.7 C) (Oral)   Resp 16   SpO2 90%   Awake, alert, oriented  Speech fluent, appropriate  CN grossly intact  5/5 BUE/BLE   IMAGING: CT reviewed demonstrating diffuse basal and primarily interhemispheric SAH. No HCP. CTA also personally reviewed demonstrating aneurysm arising from right A1-2 junction projecting anteroinferiorly and to the left.  IMPRESSION:  49 y.o. 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  PLAN: - Will proceed with diagnostic angiogram and appropriate treatment for Acom aneurysm, based on CTA likely coil embolization.  I spoke at length with the patient and her husband over the phone regarding the imaging findings thus far. I reviewed with them the possible treatment options for intracranial aneurysms including endovascular coiling and open clip ligation. The risks of the angiogram, coiling, and surgical clipping were also reviewed to include stroke and aneurysm re-rupture leading to weakness/paralysis/coma/death, infection, SZ, hydrocephalus. We discussed risk of re-rupture without aneurysm treatment which far outweighs operative risks.  The patient and her husband understood our discussion. All her questions this am were answered. She provided consent to proceed with diagnostic angiogram and the appropriate treatment for her aneurysm.

## 2020-02-09 NOTE — Progress Notes (Signed)
PT Cancellation Note  Patient Details Name: Sabrina [name] MRN: [id number] DOB: 1971-04-30   Cancelled Treatment:    Reason Eval/Treat Not Completed: Active bedrest order   Van Clines, PT  Acute Rehabilitation Services Pager [phone] Office [phone]    Levi Aland 02/09/2020, 7:38 AM

## 2020-02-09 NOTE — Sedation Documentation (Signed)
Pt taken to PACU for recovery. Groin and pulses unchanged, see flowsheet.

## 2020-02-09 NOTE — Progress Notes (Signed)
OT Cancellation Note  Patient Details Name: Sabrina [name] MRN: [id number] DOB: [birth year]   Cancelled Treatment:    Reason Eval/Treat Not Completed: Active bedrest order. Will return as schedule allows and pt medically ready.  Kathlee Barnhardt M Nashiya Disbrow Willet Schleifer MSOT, OTR/L Acute Rehab Pager: [phone] Office: [phone] 02/09/2020, 7:45 AM

## 2020-02-09 NOTE — Brief Op Note (Signed)
  NEUROSURGERY BRIEF OPERATIVE  NOTE   PREOP DX: Subarachnoid Hemorrhage  POSTOP DX: Same  PROCEDURE: Diagnostic cerebral angiogram, coil embolization of Acom aneurysm  SURGEON: Dr. Lisbeth Renshaw, MD  ANESTHESIA: GETA  EBL: Minimal  SPECIMENS: None  COMPLICATIONS: None  CONDITION: Stable to recovery  FINDINGS (Full report in [facility]): 1. Successful coil embolization of ruptured 64mm Acom aneurysm. No immediate local thrombotic or distal embolic complications were noted. 2. No other intracranial aneurysms, AVMs, or fistulas were seen.

## 2020-02-09 NOTE — Anesthesia Procedure Notes (Signed)
Arterial Line Insertion Start/End2/21/2021 9:30 AM, 02/09/2020 9:35 AM Performed by: Colon Flattery, CRNA, CRNA  Patient location: Pre-op. Preanesthetic checklist: patient identified, IV checked, site marked, risks and benefits discussed, surgical consent, monitors and equipment checked, pre-op evaluation and timeout performed Lidocaine 1% used for infiltration and patient sedated Left, radial was placed Catheter size: 20 G Hand hygiene performed  and maximum sterile barriers used   Attempts: 1 Procedure performed without using ultrasound guided technique. Following insertion, Biopatch and dressing applied. Post procedure assessment: normal  Patient tolerated the procedure well with no immediate complications.

## 2020-02-09 NOTE — Sedation Documentation (Signed)
8Fr sheath removed for RIGHT femoral artery by Earlene Plater, RTR. Hemostasis achieved using Exoseal closure device. Groin level 0, RDP 4+, RPT D+.

## 2020-02-09 NOTE — Sedation Documentation (Signed)
RIGHT femoral artery puncture.  

## 2020-02-09 NOTE — Transfer of Care (Signed)
Immediate Anesthesia Transfer of Care Note  Patient: Sabrina [name]  Procedure(s) Performed: RADIOLOGY WITH ANESTHESIA (N/A )  Patient Location: PACU  Anesthesia Type:General  Level of Consciousness: awake, alert  and oriented  Airway & Oxygen Therapy: Patient Spontanous Breathing and Patient connected to nasal cannula oxygen  Post-op Assessment: Report given to RN, Post -op Vital signs reviewed and stable and Patient moving all extremities  Post vital signs: Reviewed and stable  Last Vitals:  Vitals Value Taken Time  BP 112/65 02/09/20 1155  Temp    Pulse 60 02/09/20 1159  Resp 14 02/09/20 1159  SpO2 96 % 02/09/20 1159  Vitals shown include unvalidated device data.  Last Pain:  Vitals:   02/09/20 0800  TempSrc:   PainSc: 9          Complications: No apparent anesthesia complications

## 2020-02-09 NOTE — Anesthesia Procedure Notes (Signed)
Procedure Name: Intubation Date/Time: 02/09/2020 10:01 AM Performed by: Amadeo Garnet, CRNA Pre-anesthesia Checklist: Patient identified, Emergency Drugs available, Suction available and Patient being monitored Patient Re-evaluated:Patient Re-evaluated prior to induction Oxygen Delivery Method: Circle system utilized Preoxygenation: Pre-oxygenation with 100% oxygen Induction Type: IV induction Ventilation: Mask ventilation without difficulty Laryngoscope Size: Mac and 3 Grade View: Grade I Tube type: Oral Tube size: 7.0 mm Number of attempts: 1 Airway Equipment and Method: Stylet Placement Confirmation: ETT inserted through vocal cords under direct vision,  positive ETCO2 and breath sounds checked- equal and bilateral Secured at: 21 cm Tube secured with: Tape Dental Injury: Teeth and Oropharynx as per pre-operative assessment

## 2020-02-09 NOTE — Anesthesia Postprocedure Evaluation (Signed)
Anesthesia Post Note  Patient: Sabrina [name]  Procedure(s) Performed: RADIOLOGY WITH ANESTHESIA (N/A )     Patient location during evaluation: PACU Anesthesia Type: General Level of consciousness: awake and alert Pain management: pain level controlled Vital Signs Assessment: post-procedure vital signs reviewed and stable Respiratory status: spontaneous breathing, nonlabored ventilation, respiratory function stable and patient connected to nasal cannula oxygen Cardiovascular status: blood pressure returned to baseline and stable Postop Assessment: no apparent nausea or vomiting Anesthetic complications: no    Last Vitals:  Vitals:   02/09/20 1155 02/09/20 1210  BP: 112/65 110/70  Pulse: 78 74  Resp: 20 14  Temp: 36.4 C   SpO2: 92% 92%    Last Pain:  Vitals:   02/09/20 1210  TempSrc:   PainSc: 0-No pain                 Gevorg Brum,W. EDMOND

## 2020-02-09 NOTE — Anesthesia Preprocedure Evaluation (Addendum)
Anesthesia Evaluation  Patient identified by MRN, date of birth, ID band Patient awake    Reviewed: Allergy & Precautions, H&P , NPO status , Patient's Chart, lab work & pertinent test results  Airway Mallampati: II  TM Distance: >3 FB Neck ROM: Full    Dental no notable dental hx. (+) Edentulous Upper, Edentulous Lower, Dental Advisory Given   Pulmonary asthma , Current Smoker and Patient abstained from smoking.,    Pulmonary exam normal breath sounds clear to auscultation       Cardiovascular negative cardio ROS   Rhythm:Regular Rate:Normal     Neuro/Psych negative neurological ROS  negative psych ROS   GI/Hepatic negative GI ROS, Neg liver ROS,   Endo/Other  negative endocrine ROS  Renal/GU negative Renal ROS  negative genitourinary   Musculoskeletal  (+) Fibromyalgia -  Abdominal   Peds  Hematology negative hematology ROS (+)   Anesthesia Other Findings   Reproductive/Obstetrics negative OB ROS                            Anesthesia Physical Anesthesia Plan  ASA: II and emergent  Anesthesia Plan: General   Post-op Pain Management:    Induction: Intravenous, Rapid sequence and Cricoid pressure planned  PONV Risk Score and Plan: 3 and Ondansetron, Dexamethasone and Diphenhydramine  Airway Management Planned: Oral ETT  Additional Equipment: Arterial line  Intra-op Plan:   Post-operative Plan: Extubation in OR  Informed Consent: I have reviewed the patients History and Physical, chart, labs and discussed the procedure including the risks, benefits and alternatives for the proposed anesthesia with the patient or authorized representative who has indicated his/her understanding and acceptance.     Dental advisory given  Plan Discussed with: CRNA and Surgeon  Anesthesia Plan Comments:         Anesthesia Quick Evaluation

## 2020-02-10 ENCOUNTER — Inpatient Hospital Stay (HOSPITAL_COMMUNITY): Payer: PRIVATE HEALTH INSURANCE

## 2020-02-10 DIAGNOSIS — I609 Nontraumatic subarachnoid hemorrhage, unspecified: Secondary | ICD-10-CM

## 2020-02-10 MED ORDER — MUPIROCIN 2 % EX OINT
1.0000 "application " | TOPICAL_OINTMENT | Freq: Two times a day (BID) | CUTANEOUS | Status: AC
  Administered 2020-02-10 – 2020-02-14 (×10): 1 via NASAL
  Filled 2020-02-10 (×2): qty 22

## 2020-02-10 MED ORDER — PROMETHAZINE HCL 25 MG PO TABS
12.5000 mg | ORAL_TABLET | ORAL | Status: DC | PRN
  Administered 2020-02-10 – 2020-02-16 (×28): 12.5 mg via ORAL
  Filled 2020-02-10 (×30): qty 1

## 2020-02-10 NOTE — Evaluation (Cosign Needed)
Speech Language Pathology Evaluation Patient Details Name: Sabrina [name] MRN: [id number] DOB: 1971-11-30 Today's Date: 02/10/2020 Time: 1105-1130 SLP Time Calculation (min) (ACUTE ONLY): 25 min  Problem List:  Patient Active Problem List   Diagnosis Date Noted  . Subarachnoid hemorrhage (HCC) 02/08/2020  . Irregular menses 03/15/2017   Past Medical History:  Past Medical History:  Diagnosis Date  . Asthma in remission   . Back pain, chronic   . Fibromyalgia    Past Surgical History:  Past Surgical History:  Procedure Laterality Date  . BREAST BIOPSY Right   . IR TRANSCATH/EMBOLIZ  02/09/2020   HPI:  Pt is a 49 yo female admitted with headache and nausea.  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 Coil placed.  Pt with h/o back pain, asthma, smoker.   Assessment / Plan / Recommendation Clinical Impression   Pt's cognitive-linguistic functioning was assessed via the COGNISTAT. Pt scored within the average range, and per pt and family report, she is currently functioning at her baseline. Her husband also reported that this am she was paying bills and contacted him to direct him further, which made him notice she was functioning at baseline. As she returns home, recommend that she has 24hr supervision initially, but no further services are necessary. SLP did provide education regarding the option of continued services if any deficits/difficulties arise while in the hospital. Pt and husband were in agreement with plan. Will s/o at this time.    SLP Assessment  SLP Recommendation/Assessment: Patient does not need any further Speech Lanaguage Pathology Services SLP Visit Diagnosis: Cognitive communication deficit (R41.841)    Follow Up Recommendations  24 hour supervision/assistance    Frequency and Duration           SLP Evaluation Cognition  Overall Cognitive Status: Within Functional Limits for tasks assessed       Comprehension  Auditory Comprehension Overall Auditory  Comprehension: Appears within functional limits for tasks assessed Visual Recognition/Discrimination Discrimination: Within Function Limits Reading Comprehension Reading Status: Within funtional limits    Expression Expression Primary Mode of Expression: Verbal Verbal Expression Overall Verbal Expression: Appears within functional limits for tasks assessed Written Expression Dominant Hand: Right   Oral / Motor  Oral Motor/Sensory Function Overall Oral Motor/Sensory Function: Within functional limits Motor Speech Overall Motor Speech: Appears within functional limits for tasks assessed   GO            Maudry Mayhew, Student SLP Office: [phone]         02/10/2020, 12:56 PM

## 2020-02-10 NOTE — Progress Notes (Signed)
  NEUROSURGERY PROGRESS NOTE   Pt seen and examined. No issues overnight. C/o continued HA and nausea. No c/o new N/T/W.   EXAM: Temp:  [97.6 F (36.4 C)-98.6 F (37 C)] 98.5 F (36.9 C) (02/22 0700) Pulse Rate:  [46-97] 80 (02/22 0700) Resp:  [11-22] 11 (02/22 0700) BP: (106-146)/(63-91) 121/91 (02/22 0700) SpO2:  [87 %-100 %] 89 % (02/22 0700) Arterial Line BP: (93-155)/(61-89) 144/84 (02/22 0700) Intake/Output      02/21 0701 - 02/22 0700 02/22 0701 - 02/23 0700   P.O. 300    I.V. 3955.8 85.5   Total Intake 4255.8 85.5   Urine 850    Blood 15    Total Output 865    Net +3390.8 +85.5         Awake, alert, oriented Speech fluent CN intact MAE well Right groin site soft  LABS: Lab Results  Component Value Date   CREATININE 0.77 02/08/2020   BUN 12 02/08/2020   NA 140 02/08/2020   K 4.0 02/08/2020   CL 107 02/08/2020   CO2 21 (L) 02/08/2020   Lab Results  Component Value Date   WBC 9.2 02/08/2020   HGB 13.8 02/08/2020   HCT 42.0 02/08/2020   MCV 93.3 02/08/2020   PLT 422 (H) 02/08/2020    IMPRESSION: - 49 y.o. female SAH d# 2 s/p coiling Acom aneurysm, neurologically intact  PLAN: - Cont routine SAH/spasm watch - TCD today - Cont Nimotop - OOB - Will add phenergan for nausea

## 2020-02-10 NOTE — Progress Notes (Deleted)
MD Roda Shutters made aware of temperature increasing from 100.2 to 101.68F despite tylenol. More tylenol given, ice packs placed. New orders placed by MD Xu. All other vital signs within normal limits. Will continue to monitor.

## 2020-02-10 NOTE — Progress Notes (Signed)
Transcranial Doppler  Date POD PCO2 HCT BP  MCA ACA PCA OPTH SIPH VERT Basilar  2/22 MS SAHday2  42 130/ 70 Right  Left   110  98   -49  -58   14  *   28  20   44  49   -48  -27   -58           Right  Left                                            Right  Left                                             Right  Left                                             Right  Left                                            Right  Left                                            Right  Left                                        MCA = Middle Cerebral Artery      OPHT = Opthalmic Artery     BASILAR = Basilar Artery   ACA = Anterior Cerebral Artery     SIPH = Carotid Siphon PCA = Posterior Cerebral Artery   VERT = Verterbral Artery                   Normal MCA = 62+\-12 ACA = 50+\-12 PCA = 42+\-23   Right [place]ard ratio=3.55 Left " = 2.45 *Not insonated 02/10/2020 2:28 PM Gertie Fey, MHA, RVT, RDCS, RDMS

## 2020-02-10 NOTE — Evaluation (Signed)
Occupational Therapy Evaluation Patient Details Name: Sabrina [name] MRN: [id number] DOB: 23-Sep-1971 Today's Date: 02/10/2020    History of Present Illness Pt is a 49 yo female admitted with headache and nausea.  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 Coil placed.  Pt with h/o back pain, asthma, smoker.    Clinical Impression   Pt admitted with the above diagnosis and has the deficits listed below. Pt would benefit from cont OT to increase independence and balance with basic adls and adl transfers so she can d/c home with her husband who can help 24/7 as needed. Husband is a truck driver and if he has to return to work, pt has a friend that can stay with her. Pt mobilized well despite a significant headache and chronic back pain.  Pt does not use O2 at home.  Was on 3L O2 in room that was removed to mobilize and pt dropped to 79%.  O2 replaced and pt to 92%.  Pt smokes daily at home and uses inhaler on regular basis. Feel pt could d/c home if someone is with her 24/7.  If this, for any reason, is not available, will need to consider different plan as pt not safe at this time to d/c home alone.  Will focus future sessions on tolerating more adls in standing position.    Follow Up Recommendations  Supervision/Assistance - 24 hour;No OT follow up    Equipment Recommendations  None recommended by OT    Recommendations for Other Services       Precautions / Restrictions Precautions Precautions: Fall Restrictions Weight Bearing Restrictions: No      Mobility Bed Mobility Overal bed mobility: Needs Assistance Bed Mobility: Supine to Sit     Supine to sit: Min guard     General bed mobility comments: min guard only for safety.  Pt attempted to lay down several times due to coughing but encouraged to stay up in chair.   Transfers Overall transfer level: Needs assistance Equipment used: 1 person hand held assist Transfers: Sit to/from [facility] Sit to Stand:  Min assist Stand pivot transfers: Min assist       General transfer comment: Pt mildly impulisve.  May be part of her personality.      Balance Overall balance assessment: Needs assistance Sitting-balance support: Feet supported Sitting balance-Leahy Scale: Good Sitting balance - Comments: Pt leaned to L several times but feel she was wanting to lay back down.  Once on EOB no issues with balance and kept balance on toilet.   Standing balance support: During functional activity;Single extremity supported Standing balance-Leahy Scale: Fair Standing balance comment: Pt relies out outside support to stand at this time.  Pt did report dizziness and nausea.                           ADL either performed or assessed with clinical judgement   ADL Overall ADL's : Needs assistance/impaired Eating/Feeding: Set up;Sitting   Grooming: Wash/dry hands;Wash/dry face;Min guard;Standing;Cueing for safety   Upper Body Bathing: Set up;Sitting;Cueing for safety   Lower Body Bathing: Minimal assistance;Sit to/from stand;Cueing for compensatory techniques Lower Body Bathing Details (indicate cue type and reason): Pt donned socks by crossing legs up.  Pt requires assist once in standing due to poor balance. Upper Body Dressing : Set up;Sitting   Lower Body Dressing: Minimal assistance;Cueing for compensatory techniques;Sit to/from stand Lower Body Dressing Details (indicate cue type and reason): assist  when on her feet. Toilet Transfer: Minimal assistance;Ambulation;Grab bars;Comfort height toilet;Cueing for safety Toilet Transfer Details (indicate cue type and reason): Pt walked to bathroom with min assist.  Pt mildly impulsive. Pt with headache and nausea at time of evaluation. Toileting- Clothing Manipulation and Hygiene: Minimal assistance;Sit to/from stand;Cueing for safety Toileting - Clothing Manipulation Details (indicate cue type and reason): Pt stood to clean self and required min  assist to maintain balance.      Functional mobility during ADLs: Minimal assistance General ADL Comments: Pt limited to today due to dizziness, nausea and pain.  Pt walked to bathroom with assist for safety when on her feet.  Pt moves well and feel she will improve quickly once feeling better.       Vision Baseline Vision/History: No visual deficits Patient Visual Report: Blurring of vision Vision Assessment?: Vision impaired- to be further tested in functional context Additional Comments: Pt states she has had blurry vision for about 6 months. Pt scans well Bly and periperhal vision intact. Further testing deferred due to feeling dizzy and nauseaous.  Overall, vision appears functional.     Perception Perception Perception Tested?: No   Praxis Praxis Praxis tested?: Within functional limits    Pertinent Vitals/Pain Pain Assessment: Faces Faces Pain Scale: Hurts whole lot Pain Location: head Pain Descriptors / Indicators: Aching Pain Intervention(s): Limited activity within patient's tolerance;Monitored during session;Premedicated before session;Repositioned;Patient requesting pain meds-RN notified     Hand Dominance Right   Extremity/Trunk Assessment Upper Extremity Assessment Upper Extremity Assessment: Overall WFL for tasks assessed   Lower Extremity Assessment Lower Extremity Assessment: Defer to PT evaluation   Cervical / Trunk Assessment Cervical / Trunk Assessment: Kyphotic   Communication Communication Communication: No difficulties   Cognition Arousal/Alertness: Awake/alert Behavior During Therapy: WFL for tasks assessed/performed Overall Cognitive Status: Within Functional Limits for tasks assessed                                 General Comments: Pt quick and accurate when answering all questions.  Fully oriented.   General Comments  Pt on 3L O2 this am.  O2 removed to mobilize and O2 sats dropped to 79%.  Pt recovered to 92% once O2  replaced.    Exercises     Shoulder Instructions      Home Living Family/patient expects to be discharged to:: Private residence Living Arrangements: Spouse/significant other Available Help at Discharge: Friend(s);Family;Available 24 hours/day Type of Home: House Home Access: Stairs to enter [employer] of Steps: 2   Home Layout: One level     Bathroom Shower/Tub: IT trainer: Standard     Home Equipment: Walker - standard;Bedside commode;Wheelchair - manual   Additional Comments: equipment is from her grandparents.      Prior Functioning/Environment Level of Independence: Independent        Comments: Pt fully independent, works part time and drives.         OT Problem List: Decreased activity tolerance;Impaired balance (sitting and/or standing);Decreased knowledge of use of DME or AE;Pain      OT Treatment/Interventions: Self-care/ADL training;DME and/or AE instruction;Balance training;Therapeutic activities    OT Goals(Current goals can be found in the care plan section) Acute Rehab OT Goals Patient Stated Goal: to get better OT Goal Formulation: With patient/family Time For Goal Achievement: 02/24/20 Potential to Achieve Goals: Good ADL Goals Pt Will Perform Grooming: with supervision;standing Pt Will Perform Lower  Body Dressing: with supervision;sit to/from stand Pt Will Perform Tub/Shower Transfer: Tub transfer;with supervision;ambulating Additional ADL Goal #1: Pt will walk to bathroom and complete toileting with comfort commode and supervision.  OT Frequency: Min 2X/week   Barriers to D/C:    husband states he can stop driving truck while she is home. Pt states she also has friend that can stay with her if necesary.       Co-evaluation PT/OT/SLP Co-Evaluation/Treatment: Yes Reason for Co-Treatment: Complexity of the patient's impairments (multi-system involvement) PT goals addressed during session:  Mobility/safety with mobility OT goals addressed during session: ADL's and self-care      AM-PAC OT "6 Clicks" Daily Activity     Outcome Measure Help from another person eating meals?: None Help from another person taking care of personal grooming?: A Little Help from another person toileting, which includes using toliet, bedpan, or urinal?: A Little Help from another person bathing (including washing, rinsing, drying)?: A Little Help from another person to put on and taking off regular upper body clothing?: A Little Help from another person to put on and taking off regular lower body clothing?: A Little 6 Click Score: 19   End of Session Equipment Utilized During Treatment: Oxygen Nurse Communication: Mobility status  Activity Tolerance: Patient limited by pain Patient left: in chair;with call bell/phone within reach;with chair alarm set;with family/visitor present  OT Visit Diagnosis: Unsteadiness on feet (R26.81);Dizziness and giddiness (R42);Pain Pain - part of body: (head)                Time: 5784-6962 OT Time Calculation (min): 29 min Charges:  OT General Charges $OT Visit: 1 Visit OT Evaluation $OT Eval Moderate Complexity: 1 Mod  Hope Budds 02/10/2020, 11:07 AM

## 2020-02-10 NOTE — Evaluation (Signed)
Physical Therapy Evaluation Patient Details Name: Sabrina [name] MRN: [id number] DOB: 03/08/1971 Today's Date: 02/10/2020   History of Present Illness  Pt is a 49 yo female admitted with headache and nausea.  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 Coil placed.  Pt with h/o back pain, asthma, smoker.     Clinical Impression  Pt admitted with above. Pt impulsive (suspect this to be baseline), unsteady with c/o nausea and dizziness. Pt requiring minA for mobility and safety to prevent fall. Spouse reports he can provide 24/7 assist as he will take time off from truck driving. Acute PT to cont to follow.    Follow Up Recommendations Home health PT;Supervision/Assistance - 24 hour    Equipment Recommendations  None recommended by PT    Recommendations for Other Services       Precautions / Restrictions Precautions Precautions: Fall Restrictions Weight Bearing Restrictions: No      Mobility  Bed Mobility Overal bed mobility: Needs Assistance Bed Mobility: Supine to Sit     Supine to sit: Min guard     General bed mobility comments: pt impulsively quick requiring min guard for safety, pt with onset of dizziness and nausea and tried to return to return to supine and then had a bad productive coughing spell with bloody nose, RN notified and addressed and pt then received a breathing treatment  Transfers Overall transfer level: Needs assistance Equipment used: 1 person hand held assist Transfers: Sit to/from Stand Sit to Stand: Min assist Stand pivot transfers: Min assist       General transfer comment: pt mildly impulsive and had urinary urgency   Ambulation/Gait Ambulation/Gait assistance: Min assist Gait Distance (Feet): 20 Feet(to/from bathroom) Assistive device: 1 person hand held assist Gait Pattern/deviations: Step-through pattern;Decreased stride length;Trunk flexed Gait velocity: impulsively fast Gait velocity interpretation: <1.8 ft/sec, indicate of risk  for recurrent falls General Gait Details: pt unsteady, furniture walking, impulsively fast requiring minA for safety and to stabilize  Stairs            Wheelchair Mobility    Modified Rankin (Stroke Patients Only) Modified Rankin (Stroke Patients Only) Pre-Morbid Rankin Score: No significant disability Modified Rankin: Moderate disability     Balance Overall balance assessment: Needs assistance Sitting-balance support: Feet supported Sitting balance-Leahy Scale: Good Sitting balance - Comments: Pt leaned to L several times but feel she was wanting to lay back down.  Once on EOB no issues with balance and kept balance on toilet.   Standing balance support: During functional activity;Single extremity supported Standing balance-Leahy Scale: Fair Standing balance comment: Pt relies out outside support to stand at this time.  Pt did report dizziness and nausea.                             Pertinent Vitals/Pain Pain Assessment: Faces Pain Score: 8  Faces Pain Scale: Hurts whole lot Pain Location: head Pain Descriptors / Indicators: Aching Pain Intervention(s): Limited activity within patient's tolerance    Home Living Family/patient expects to be discharged to:: Private residence Living Arrangements: Spouse/significant other Available Help at Discharge: Friend(s);Family;Available 24 hours/day Type of Home: House Home Access: Stairs to enter   [employer] of Steps: 2 Home Layout: One level Home Equipment: Walker - standard;Bedside commode;Wheelchair - manual Additional Comments: equipment is from her grandparents.    Prior Function Level of Independence: Independent         Comments: Pt fully independent, works part  time and drives.      Hand Dominance   Dominant Hand: Right    Extremity/Trunk Assessment   Upper Extremity Assessment Upper Extremity Assessment: Overall WFL for tasks assessed    Lower Extremity Assessment Lower  Extremity Assessment: Generalized weakness    Cervical / Trunk Assessment Cervical / Trunk Assessment: Kyphotic(pt reports "my back is fusing on its own")  Communication   Communication: No difficulties  Cognition Arousal/Alertness: Awake/alert Behavior During Therapy: WFL for tasks assessed/performed Overall Cognitive Status: Within Functional Limits for tasks assessed                                 General Comments: pt can be impulsive and with decreased insight to safety but suspect this to be baseline      General Comments General comments (skin integrity, edema, etc.): Pt not on home O2 but was on 3Lo2 at 94%, pt dropped to 79% on RA during session, Rn notified, pt placed back on O2    Exercises     Assessment/Plan    PT Assessment Patient needs continued PT services  PT Problem List Decreased strength;Decreased range of motion;Decreased activity tolerance;Decreased balance;Decreased mobility;Decreased coordination;Decreased cognition;Decreased knowledge of use of DME;Decreased safety awareness;Decreased knowledge of precautions       PT Treatment Interventions DME instruction;Gait training;Stair training;Functional mobility training;Therapeutic activities;Therapeutic exercise;Balance training;Neuromuscular re-education    PT Goals (Current goals can be found in the Care Plan section)  Acute Rehab PT Goals Patient Stated Goal: to get better PT Goal Formulation: With patient/family Time For Goal Achievement: 02/24/20 Potential to Achieve Goals: Good    Frequency Min 4X/week   Barriers to discharge Other (comment) spouse is a truck driver but states he is taking time off    Co-evaluation PT/OT/SLP Co-Evaluation/Treatment: Yes Reason for Co-Treatment: Complexity of the patient's impairments (multi-system involvement) PT goals addressed during session: Mobility/safety with mobility OT goals addressed during session: ADL's and self-care       AM-PAC  PT "6 Clicks" Mobility  Outcome Measure Help needed turning from your back to your side while in a flat bed without using bedrails?: A Little Help needed moving from lying on your back to sitting on the side of a flat bed without using bedrails?: A Little Help needed moving to and from a bed to a chair (including a wheelchair)?: A Little Help needed standing up from a chair using your arms (e.g., wheelchair or bedside chair)?: A Little Help needed to walk in hospital room?: A Lot Help needed climbing 3-5 steps with a railing? : A Lot 6 Click Score: 16    End of Session Equipment Utilized During Treatment: Gait belt Activity Tolerance: Patient limited by fatigue Patient left: in chair;with call bell/phone within reach;with chair alarm set;with family/visitor present Nurse Communication: Mobility status PT Visit Diagnosis: Unsteadiness on feet (R26.81);Other abnormalities of gait and mobility (R26.89);Muscle weakness (generalized) (M62.81);Difficulty in walking, not elsewhere classified (R26.2)    Time: 7371-0626 PT Time Calculation (min) (ACUTE ONLY): 36 min   Charges:   PT Evaluation $PT Eval Moderate Complexity: 1 Mod          Lewis Shock, PT, DPT Acute Rehabilitation Services Pager #: [phone] Office #: [phone]   Iona Hansen 02/10/2020, 1:25 PM

## 2020-02-11 ENCOUNTER — Other Ambulatory Visit (HOSPITAL_COMMUNITY): Payer: Self-pay

## 2020-02-11 ENCOUNTER — Encounter (HOSPITAL_COMMUNITY): Payer: Self-pay

## 2020-02-11 ENCOUNTER — Inpatient Hospital Stay (HOSPITAL_COMMUNITY): Payer: PRIVATE HEALTH INSURANCE

## 2020-02-11 NOTE — Progress Notes (Signed)
PT Cancellation Note  Patient Details Name: Sabrina [name] MRN: [id number] DOB: [birth year]   Cancelled Treatment:    Reason Eval/Treat Not Completed: Other (comment). Pt c/o 10+/10 headache, deferred ambulation due to just getting back to bed from using BSC and having 10+/10 headache. Pt reports she isn't getting relief from current pain regimen,that it just makes her sleepy but doesn't take her pain away. RN notified. Acute PT to return as able to progress mobility.  Lewis Shock, PT, DPT Acute Rehabilitation Services Pager #: [phone] Office #: [phone]    Iona Hansen 02/11/2020, 1:39 PM

## 2020-02-11 NOTE — Progress Notes (Signed)
Occupational Therapy Treatment Patient Details Name: Sabrina [name] MRN: [id number] DOB: 05-Jul-1971 Today's Date: 02/11/2020    History of present illness Pt is a 49 yo female admitted with headache and nausea.  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 Coil placed.  Pt with h/o back pain, asthma, smoker.    OT comments  Pt making steady progress towards OT goals this session. Pt presents with impaired balance, decreased activity tolerance, generalized weakness and dyspnea with exertion requiring 15L NRB impacting pts ability to engage in ADLs. Overall, pt requires MIN A for functional mobility with hand held assist noted to impulsively sit down during standing grooming tasks. Pt reports dizziness ( BP WNL) during standing grooming tasks needing to sit down frequently during standing grooming tasks. Pt noted to have STM deficits as pt unable to recall 3 words stated at beginning of session. Feel pt now more appropriate for HHOT to increase activity tolerance for ADL participation and provided ongoing education on energy conservations strategies to facilitate IADL engagement as pt was independent prior to admission. Will let OT/L know about change in POC, will follow acutely for OT needs.    Follow Up Recommendations  Supervision/Assistance - 24 hour;Home health OT    Equipment Recommendations  None recommended by OT    Recommendations for Other Services      Precautions / Restrictions Precautions Precautions: Fall Precaution Comments: 15 L NRB Restrictions Weight Bearing Restrictions: No       Mobility Bed Mobility Overal bed mobility: Needs Assistance Bed Mobility: Supine to Sit     Supine to sit: Min guard     General bed mobility comments: min guard for safety d/t dizziness  Transfers Overall transfer level: Needs assistance Equipment used: 1 person hand held assist Transfers: Sit to/from Stand Sit to Stand: Min assist         General transfer comment: light min A  to power into standing from EOB and toilet    Balance Overall balance assessment: Needs assistance Sitting-balance support: Feet supported Sitting balance-Leahy Scale: Good     Standing balance support: During functional activity;Single extremity supported Standing balance-Leahy Scale: Poor Standing balance comment: reliant on at least one UE supported                           ADL either performed or assessed with clinical judgement   ADL Overall ADL's : Needs assistance/impaired     Grooming: Wash/dry face;Standing;Min guard Grooming Details (indicate cue type and reason): min guard for balance as pt slightly impulsive noted to sit down impulsively during standing grooming tasks Upper Body Bathing: Sitting;Standing;Supervision/ safety   Lower Body Bathing: Sit to/from stand;Min guard Lower Body Bathing Details (indicate cue type and reason): min guard for balance         Toilet Transfer: Minimal assistance;Ambulation;Grab bars;Comfort height toilet;Cueing for safety Toilet Transfer Details (indicate cue type and reason): Pt walked to bathroom with min assist.  Pt mildly impulsive. Toileting- Clothing Manipulation and Hygiene: Minimal assistance;Sit to/from stand Toileting - Clothing Manipulation Details (indicate cue type and reason): MIN A for balance during sit<>stand for anterior pericare     Functional mobility during ADLs: Minimal assistance General ADL Comments: pt on 15 L NRB this session limited by weakness, fatigue and dyspnea with minimal exertion. session focus toilet transfer/ hygiene and standing/ sitting grooming tasks to increase activity tolerance     Vision       Perception  Praxis      Cognition Arousal/Alertness: Awake/alert Behavior During Therapy: WFL for tasks assessed/performed Overall Cognitive Status: Impaired/Different from baseline Area of Impairment: Safety/judgement;Memory;Problem solving                      Memory: Decreased short-term memory   Safety/Judgement: Decreased awareness of safety;Decreased awareness of deficits     General Comments: pt AxOx4,  unable to recall 3 words stated at beginning of session. pt with poor safety awareness noted to be slightly impulsive, unclear if impulsivity is baseline behavior.        Exercises     Shoulder Instructions       General Comments pt on 15 L NRB during session.    Pertinent Vitals/ Pain       Pain Assessment: Faces Faces Pain Scale: Hurts whole lot Pain Location: head Pain Descriptors / Indicators: Aching Pain Intervention(s): Limited activity within patient's tolerance;Monitored during session;Repositioned  Home Living                                          Prior Functioning/Environment              Frequency  Min 2X/week        Progress Toward Goals  OT Goals(current goals can now be found in the care plan section)  Progress towards OT goals: Progressing toward goals  Acute Rehab OT Goals Patient Stated Goal: to get better OT Goal Formulation: With patient/family Time For Goal Achievement: 02/24/20 Potential to Achieve Goals: Good  Plan Discharge plan remains appropriate    Co-evaluation                 AM-PAC OT "6 Clicks" Daily Activity     Outcome Measure   Help from another person eating meals?: None Help from another person taking care of personal grooming?: A Little Help from another person toileting, which includes using toliet, bedpan, or urinal?: A Little Help from another person bathing (including washing, rinsing, drying)?: A Little Help from another person to put on and taking off regular upper body clothing?: A Little Help from another person to put on and taking off regular lower body clothing?: A Little 6 Click Score: 19    End of Session Equipment Utilized During Treatment: Gait belt;Oxygen;Other (comment)(15 L NRB)  OT Visit Diagnosis: Unsteadiness on  feet (R26.81);Dizziness and giddiness (R42);Pain   Activity Tolerance Patient limited by pain;Patient tolerated treatment well   Patient Left in bed;with call bell/phone within reach;with bed alarm set   Nurse Communication          Time: 1010-1045 OT Time Calculation (min): 35 min  Charges: OT General Charges $OT Visit: 1 Visit OT Treatments $Self Care/Home Management : 23-37 mins  Lanier Clam., COTA/L Acute Rehabilitation Services [phone]    Ihor Gully 02/11/2020, 2:47 PM

## 2020-02-11 NOTE — Progress Notes (Signed)
  NEUROSURGERY PROGRESS NOTE   Pt seen and examined. No issues overnight. Unchanged HA. Nausea improved with phenergan.   EXAM: Temp:  [97.8 F (36.6 C)-98.9 F (37.2 C)] 97.8 F (36.6 C) (02/23 0400) Pulse Rate:  [47-82] 72 (02/23 0700) Resp:  [10-19] 18 (02/23 0700) BP: (95-136)/(6-99) 115/76 (02/23 0600) SpO2:  [82 %-100 %] 94 % (02/23 0700) Arterial Line BP: (127-143)/(63-83) 143/83 (02/22 1000) FiO2 (%):  [50 %-100 %] 100 % (02/23 0443) Intake/Output      02/22 0701 - 02/23 0700 02/23 0701 - 02/24 0700   P.O.     I.V. 1599.4    Total Intake 1599.4    Urine     Blood     Total Output     Net +1599.4         Urine Occurrence 5 x     Awake, alert, oriented Speech fluent CN intact Good strength throughout  LABS: Lab Results  Component Value Date   CREATININE 0.77 02/08/2020   BUN 12 02/08/2020   NA 140 02/08/2020   K 4.0 02/08/2020   CL 107 02/08/2020   CO2 21 (L) 02/08/2020   Lab Results  Component Value Date   WBC 9.2 02/08/2020   HGB 13.8 02/08/2020   HCT 42.0 02/08/2020   MCV 93.3 02/08/2020   PLT 422 (H) 02/08/2020    TCD: Date POD PCO2 HCT BP  MCA ACA PCA OPTH SIPH VERT Basilar  2/22 MS SAHday2  42 130/ 70 Right  Left   110  98   -49  -58   14  *   28  20   44  49   -48  -27   -58         IMPRESSION: - 49 y.o. female SAH d# 3 s/p coiling Acom aneurysm, neurologically stable. TCD unremarkable  PLAN: - Cont supportive care, spasm prophylaxis - Cont to mobilize

## 2020-02-12 ENCOUNTER — Inpatient Hospital Stay (HOSPITAL_COMMUNITY): Payer: PRIVATE HEALTH INSURANCE

## 2020-02-12 DIAGNOSIS — I609 Nontraumatic subarachnoid hemorrhage, unspecified: Secondary | ICD-10-CM

## 2020-02-12 MED ORDER — OXYCODONE HCL 5 MG PO TABS
5.0000 mg | ORAL_TABLET | Freq: Four times a day (QID) | ORAL | Status: DC | PRN
  Administered 2020-02-12 – 2020-02-13 (×5): 5 mg via ORAL
  Filled 2020-02-12: qty 1
  Filled 2020-02-12: qty 2
  Filled 2020-02-12 (×3): qty 1

## 2020-02-12 MED ORDER — ALBUTEROL SULFATE (2.5 MG/3ML) 0.083% IN NEBU
2.5000 mg | INHALATION_SOLUTION | Freq: Two times a day (BID) | RESPIRATORY_TRACT | Status: DC | PRN
  Administered 2020-02-12: 2.5 mg via RESPIRATORY_TRACT

## 2020-02-12 MED ORDER — ALBUTEROL SULFATE (2.5 MG/3ML) 0.083% IN NEBU
2.5000 mg | INHALATION_SOLUTION | Freq: Two times a day (BID) | RESPIRATORY_TRACT | Status: DC
  Administered 2020-02-12 – 2020-02-19 (×14): 2.5 mg via RESPIRATORY_TRACT
  Filled 2020-02-12 (×15): qty 3

## 2020-02-12 MED ORDER — ORAL CARE MOUTH RINSE
15.0000 mL | Freq: Two times a day (BID) | OROMUCOSAL | Status: DC
  Administered 2020-02-12 – 2020-02-17 (×11): 15 mL via OROMUCOSAL

## 2020-02-12 NOTE — Progress Notes (Signed)
  NEUROSURGERY PROGRESS NOTE   Pt seen and examined. No issues overnight. Cont to c/o HA, unable to ambulate with PT yesterday because of this.  EXAM: Temp:  [98.7 F (37.1 C)-99.2 F (37.3 C)] 99.2 F (37.3 C) (02/24 0800) Pulse Rate:  [67-128] 75 (02/24 0700) Resp:  [13-24] 17 (02/24 0700) BP: (96-136)/(51-103) 98/58 (02/24 0700) SpO2:  [84 %-100 %] 89 % (02/24 0700) FiO2 (%):  [100 %] 100 % (02/23 1632) Intake/Output      02/23 0701 - 02/24 0700 02/24 0701 - 02/25 0700   P.O. 350    I.V. 2859.6    Total Intake 3209.6    Net +3209.6         Urine Occurrence 7 x     Awake, alert, oriented Speech fluent CN intact Good strength throughout  LABS: Lab Results  Component Value Date   CREATININE 0.77 02/08/2020   BUN 12 02/08/2020   NA 140 02/08/2020   K 4.0 02/08/2020   CL 107 02/08/2020   CO2 21 (L) 02/08/2020   Lab Results  Component Value Date   WBC 9.2 02/08/2020   HGB 13.8 02/08/2020   HCT 42.0 02/08/2020   MCV 93.3 02/08/2020   PLT 422 (H) 02/08/2020    TCD: Date POD PCO2 HCT BP  MCA ACA PCA OPTH SIPH VERT Basilar  2/22 MS SAHday2  42 130/ 70 Right  Left   110  98   -49  -58   14  *   28  20   44  49   -48  -27   -58         IMPRESSION: - 49 y.o. female SAH d# 4 s/p coiling Acom aneurysm, neurologically stable.   PLAN: - Cont supportive care, spasm prophylaxis - TCD today - Cont to attempt mobilize - Will schedule albuterol MDI BID - d/c dilaudid, will add hydrocodone PRN

## 2020-02-12 NOTE — Progress Notes (Signed)
PT Cancellation Note  Patient Details Name: Sabrina [name] MRN: [id number] DOB: September 12, 1971   Cancelled Treatment:    Reason Eval/Treat Not Completed: Other (comment) x2 attempts to see patient; on 1st attempt, pt receiving Doppler, on 2nd attempt, pt with dyspnea at rest and receiving breathing treatment. Will continue efforts.     Lillia Pauls, PT, DPT Acute Rehabilitation Services Pager [phone] Office [phone]    Norval Morton 02/12/2020, 4:31 PM

## 2020-02-12 NOTE — Progress Notes (Signed)
Patient spO2 dropped into the high 70s with a good wave, increased patient's oxygen from 9L to 15L at the wall. Patient slow to rebound, persistent in the high 80s and complaining of SOB while coughing up thick, dark secretions. NSG MD notified of patient's high O2 requirementon on HFNC, PRN albuterol neb given. Patient resting more comfortably following neb, will continue to monitor.  Aris Lot, RN

## 2020-02-12 NOTE — Progress Notes (Signed)
Transcranial Doppler  Date POD PCO2 HCT BP  MCA ACA PCA OPTH SIPH VERT Basilar  2/22 MS SAHday2  42 130/ 70 Right  Left   110  98   -49  -58   14  *   28  20   44  49   -48  -27   -58      2/24 MS    124/ 64 Right  Left   97  69   -42  -63   35  30   21  18    46  44   -29  -43   -44           Right  Left                                             Right  Left                                             Right  Left                                            Right  Left                                            Right  Left                                        MCA = Middle Cerebral Artery      OPHT = Opthalmic Artery     BASILAR = Basilar Artery   ACA = Anterior Cerebral Artery     SIPH = Carotid Siphon PCA = Posterior Cerebral Artery   VERT = Verterbral Artery                   Normal MCA = 62+\-12 ACA = 50+\-12 PCA = 42+\-23    02/10/2020- [place] ratio: Right= 3.55 Left= 2.45   *Not insonated   02/12/2020- [place] ratio: Right=3.73 Left= 2.16 02/14/2020, MHA, RVT, RDCS, RDMS

## 2020-02-13 ENCOUNTER — Inpatient Hospital Stay (HOSPITAL_COMMUNITY): Payer: PRIVATE HEALTH INSURANCE

## 2020-02-13 LAB — CBC
HCT: 36.3 % (ref 36.0–46.0)
Hemoglobin: 12.3 g/dL (ref 12.0–15.0)
MCH: 30.2 pg (ref 26.0–34.0)
MCHC: 33.9 g/dL (ref 30.0–36.0)
MCV: 89.2 fL (ref 80.0–100.0)
Platelets: 356 10*3/uL (ref 150–400)
RBC: 4.07 MIL/uL (ref 3.87–5.11)
RDW: 13.8 % (ref 11.5–15.5)
WBC: 10 10*3/uL (ref 4.0–10.5)
nRBC: 0 % (ref 0.0–0.2)

## 2020-02-13 LAB — CULTURE, BLOOD (ROUTINE X 2)
Culture: NO GROWTH
Culture: NO GROWTH

## 2020-02-13 LAB — RENAL FUNCTION PANEL
Albumin: 3.1 g/dL — ABNORMAL LOW (ref 3.5–5.0)
Anion gap: 11 (ref 5–15)
BUN: 5 mg/dL — ABNORMAL LOW (ref 6–20)
CO2: 27 mmol/L (ref 22–32)
Calcium: 8.5 mg/dL — ABNORMAL LOW (ref 8.9–10.3)
Chloride: 103 mmol/L (ref 98–111)
Creatinine, Ser: 0.72 mg/dL (ref 0.44–1.00)
GFR calc Af Amer: >60 mL/min (ref >60)
GFR calc non Af Amer: >60 mL/min (ref >60)
Glucose, Bld: 137 mg/dL — ABNORMAL HIGH (ref 70–99)
Phosphorus: 2.4 mg/dL — ABNORMAL LOW (ref 2.5–4.6)
Potassium: 2.2 mmol/L — CL (ref 3.5–5.1)
Sodium: 141 mmol/L (ref 135–145)

## 2020-02-13 MED ORDER — HEPARIN SODIUM (PORCINE) 5000 UNIT/ML IJ SOLN
5000.0000 [IU] | Freq: Three times a day (TID) | INTRAMUSCULAR | Status: DC
  Administered 2020-02-13 – 2020-02-19 (×18): 5000 [IU] via SUBCUTANEOUS
  Filled 2020-02-13 (×19): qty 1

## 2020-02-13 MED ORDER — MENTHOL 3 MG MT LOZG
1.0000 | LOZENGE | OROMUCOSAL | Status: DC | PRN
  Filled 2020-02-13: qty 9

## 2020-02-13 MED ORDER — FUROSEMIDE 10 MG/ML IJ SOLN
20.0000 mg | Freq: Once | INTRAMUSCULAR | Status: AC
  Administered 2020-02-13: 20 mg via INTRAVENOUS
  Filled 2020-02-13: qty 2

## 2020-02-13 MED ORDER — POTASSIUM CHLORIDE CRYS ER 20 MEQ PO TBCR
40.0000 meq | EXTENDED_RELEASE_TABLET | ORAL | Status: AC
  Administered 2020-02-13 (×3): 40 meq via ORAL
  Filled 2020-02-13 (×3): qty 2

## 2020-02-13 MED ORDER — HYDROMORPHONE HCL 2 MG PO TABS
2.0000 mg | ORAL_TABLET | ORAL | Status: DC | PRN
  Administered 2020-02-13 – 2020-02-14 (×7): 2 mg via ORAL
  Administered 2020-02-14: 1 mg via ORAL
  Administered 2020-02-14 – 2020-02-17 (×16): 2 mg via ORAL
  Filled 2020-02-13 (×24): qty 1

## 2020-02-13 NOTE — Progress Notes (Signed)
Neurosurgery Service Progress Note  Subjective: No acute events overnight, some desats that responded to supplemental O2, stable headache, no new complaints   Objective: Vitals:   02/13/20 0500 02/13/20 0600 02/13/20 0745 02/13/20 0800  BP: (!) 103/53 (!) 100/57 106/68 (!) 98/48  Pulse: 83 70 75 73  Resp: 17 16 15 17   Temp:      TempSrc:      SpO2: 93% 94% 94% 93%   Temp (24hrs), Avg:99.1 F (37.3 C), Min:97.8 F (36.6 C), Max:100 F (37.8 C)  CBC Latest Ref Rng & Units 02/08/2020 06/17/2016 07/30/2009  WBC 4.0 - 10.5 K/uL 9.2 8.2 -  Hemoglobin 12.0 - 15.0 g/dL 09/29/2009 37.9 02.4  Hematocrit 36.0 - 46.0 % 42.0 43.7 41.0  Platelets 150 - 400 K/uL 422(H) 357 -   BMP Latest Ref Rng & Units 02/08/2020 06/17/2016 07/30/2009  Glucose 70 - 99 mg/dL 09/29/2009) 99 75  BUN 6 - 20 mg/dL 12 11 13   Creatinine 0.44 - 1.00 mg/dL 353(G 0.6  Sodium 9.92 - 145 mmol/L 140 139 140  Potassium 3.5 - 5.1 mmol/L 4.0 4.2 4.6  Chloride 98 - 111 mmol/L 107 100(L) 106  CO2 22 - 32 mmol/L 21(L) 28 -  Calcium 8.9 - 10.3 mg/dL 9.3 9.9 -    Intake/Output Summary (Last 24 hours) at 02/13/2020 0912 Last data filed at 02/13/2020 0800 Gross per 24 hour  Intake 2501.54 ml  Output 1000 ml  Net 1501.54 ml    Current Facility-Administered Medications:  .  0.9 %  sodium chloride infusion, , Intravenous, Continuous, Bergman, Meghan D, NP, Last Rate: 100 mL/hr at 02/13/20 0600, Rate Verify at 02/13/20 0600 .  acetaminophen (TYLENOL) tablet 650 mg, 650 mg, Oral, Q4H PRN, 650 mg at 02/13/20 0310 **OR** acetaminophen (TYLENOL) 160 MG/5ML solution 650 mg, 650 mg, Per Tube, Q4H PRN **OR** acetaminophen (TYLENOL) suppository 650 mg, 650 mg, Rectal, Q4H PRN, Bergman, Meghan D, NP .  albuterol (PROVENTIL) (2.5 MG/3ML) 0.083% nebulizer solution 2.5 mg, 2.5 mg, Nebulization, BID, 02/15/20, MD, 2.5 mg at 02/13/20 0745 .  albuterol (PROVENTIL) (2.5 MG/3ML) 0.083% nebulizer solution 2.5 mg, 2.5 mg, Nebulization, Q12H PRN,  Lisbeth Renshaw, MD, 2.5 mg at 02/12/20 1613 .  Chlorhexidine Gluconate Cloth 2 % PADS 6 each, 6 each, Topical, Daily, Lisbeth Renshaw, MD, 6 each at 02/12/20 1200 .  docusate sodium (COLACE) capsule 100 mg, 100 mg, Oral, BID, Bergman, Meghan D, NP, 100 mg at 02/12/20 2234 .  MEDLINE mouth rinse, 15 mL, Mouth Rinse, BID, 02/14/20, MD, 15 mL at 02/12/20 2233 .  mupirocin ointment (BACTROBAN) 2 % 1 application, 1 application, Nasal, BID, 02/14/20, MD, 1 application at 02/12/20 2233 .  niMODipine (NIMOTOP) capsule 60 mg, 60 mg, Oral, Q4H, 60 mg at 02/13/20 0625 **OR** niMODipine (NYMALIZE) 6 MG/ML oral solution 60 mg, 60 mg, Per Tube, Q4H, Bergman, Meghan D, NP, 60 mg at 02/11/20 0148 .  ondansetron (ZOFRAN-ODT) disintegrating tablet 4 mg, 4 mg, Oral, Q6H PRN **OR** ondansetron (ZOFRAN) injection 4 mg, 4 mg, Intravenous, Q6H PRN, Bergman, Meghan D, NP, 4 mg at 02/10/20 1000 .  oxyCODONE (Oxy IR/ROXICODONE) immediate release tablet 5-10 mg, 5-10 mg, Oral, Q6H PRN, 02/13/20, MD, 5 mg at 02/13/20 0625 .  pantoprazole (PROTONIX) EC tablet 40 mg, 40 mg, Oral, Daily, 40 mg at 02/12/20 1055 **OR** pantoprazole sodium (PROTONIX) 40 mg/20 mL oral suspension 40 mg, 40 mg, Per Tube, Daily, Bergman, Meghan D, NP, 40 mg at 02/09/20 1401 .  promethazine (PHENERGAN) tablet 12.5 mg, 12.5 mg, Oral, Q4H PRN, Consuella Lose, MD, 12.5 mg at 02/13/20 7353   Physical Exam: AOx3, PERRL, EOMI, FS, Strength 5/5 x4, SILTx4, no drift  Assessment & Plan: 49 y.o. woman PBD5 s/p AComm coiling, recovering well.  Neuro -headache: side effects from oxycodone, switch to po hydromorphone; pt will drink coffee for caffeine to help w/ headache - normally has high caffeine intake daily, add MgSO4 pending Mg level -exam stable, TCDs reassuring, rpt TCDs tomorrow  Cardiopulm -CXR from yesterday shows significant increase in pulmonary edema, emphysema changes -continue nebs prn -respiratory  insufficiency: continue high flow [place], wean as tolerated, will start gentle diuresis w/ 20 lasix x1  Sabrina [name] -reg diet -repeat screening labs for renal function / Na status -no e/o volume depletion, neuro stable, will hold IVF & diurese  PPx/dispo -SCDs/TEDs/SQH, ambulate as tolerated -pantoprazole, nimodipine  Sabrina [name]  02/13/20 9:12 AM

## 2020-02-13 NOTE — Progress Notes (Signed)
PT Cancellation Note  Patient Details Name: Sabrina [name] MRN: [id number] DOB: 09-09-1971   Cancelled Treatment:    Reason Eval/Treat Not Completed: Other (comment) Pt declining x 2 secondary to nausea, malaise, and dyspnea. Willing to participate tomorrow.    Lillia Pauls, PT, DPT Acute Rehabilitation Services Pager [phone] Office [phone]    Norval Morton 02/13/2020, 2:45 PM

## 2020-02-13 NOTE — Progress Notes (Signed)
CRITICAL VALUE ALERT  Critical Value:  K 2.2  Date & Time Notied:  02/13/20 1250  Provider Notified: Dr. Maurice Small  Orders Received/Actions taken: MD to order K replacement

## 2020-02-14 ENCOUNTER — Inpatient Hospital Stay (HOSPITAL_COMMUNITY): Payer: PRIVATE HEALTH INSURANCE

## 2020-02-14 DIAGNOSIS — I609 Nontraumatic subarachnoid hemorrhage, unspecified: Secondary | ICD-10-CM

## 2020-02-14 LAB — COMPREHENSIVE METABOLIC PANEL
ALT: 19 U/L (ref 0–44)
AST: 22 U/L (ref 15–41)
Albumin: 3.1 g/dL — ABNORMAL LOW (ref 3.5–5.0)
Alkaline Phosphatase: 85 U/L (ref 38–126)
Anion gap: 11 (ref 5–15)
BUN: 7 mg/dL (ref 6–20)
CO2: 27 mmol/L (ref 22–32)
Calcium: 9 mg/dL (ref 8.9–10.3)
Chloride: 103 mmol/L (ref 98–111)
Creatinine, Ser: 0.77 mg/dL (ref 0.44–1.00)
GFR calc Af Amer: >60 mL/min (ref >60)
GFR calc non Af Amer: >60 mL/min (ref >60)
Glucose, Bld: 109 mg/dL — ABNORMAL HIGH (ref 70–99)
Potassium: 3.3 mmol/L — ABNORMAL LOW (ref 3.5–5.1)
Sodium: 141 mmol/L (ref 135–145)
Total Bilirubin: 1.1 mg/dL (ref 0.3–1.2)
Total Protein: 6.4 g/dL — ABNORMAL LOW (ref 6.5–8.1)

## 2020-02-14 LAB — RENAL FUNCTION PANEL
Albumin: 2.7 g/dL — ABNORMAL LOW (ref 3.5–5.0)
Anion gap: 13 (ref 5–15)
BUN: 7 mg/dL (ref 6–20)
CO2: 26 mmol/L (ref 22–32)
Calcium: 8.6 mg/dL — ABNORMAL LOW (ref 8.9–10.3)
Chloride: 102 mmol/L (ref 98–111)
Creatinine, Ser: 0.92 mg/dL (ref 0.44–1.00)
GFR calc Af Amer: >60 mL/min (ref >60)
GFR calc non Af Amer: >60 mL/min (ref >60)
Glucose, Bld: 100 mg/dL — ABNORMAL HIGH (ref 70–99)
Phosphorus: 2.8 mg/dL (ref 2.5–4.6)
Potassium: 3.2 mmol/L — ABNORMAL LOW (ref 3.5–5.1)
Sodium: 141 mmol/L (ref 135–145)

## 2020-02-14 MED ORDER — FUROSEMIDE 10 MG/ML IJ SOLN
40.0000 mg | Freq: Once | INTRAMUSCULAR | Status: AC
  Administered 2020-02-14: 40 mg via INTRAVENOUS

## 2020-02-14 MED ORDER — POTASSIUM CHLORIDE CRYS ER 20 MEQ PO TBCR
40.0000 meq | EXTENDED_RELEASE_TABLET | ORAL | Status: AC
  Administered 2020-02-14 (×3): 40 meq via ORAL
  Filled 2020-02-14 (×3): qty 2

## 2020-02-14 MED ORDER — FUROSEMIDE 10 MG/ML IJ SOLN
INTRAMUSCULAR | Status: AC
  Filled 2020-02-14: qty 4

## 2020-02-14 NOTE — Progress Notes (Signed)
Neurosurgery Service Progress Note  Subjective: No acute events overnight, some desats that responded to supplemental O2, stable headache, no new complaints   Objective: Vitals:   02/14/20 0600 02/14/20 0700 02/14/20 0800 02/14/20 0815  BP: 121/68 115/72 100/70   Pulse: 74 74 87   Resp: 18 20 (!) 24   Temp:   99 F (37.2 C)   TempSrc:   Oral   SpO2: (!) 88% 96% 96% 96%   Temp (24hrs), Avg:99 F (37.2 C), Min:98.7 F (37.1 C), Max:99.5 F (37.5 C)  CBC Latest Ref Rng & Units 02/13/2020 02/08/2020 06/17/2016  WBC 4.0 - 10.5 K/uL 10.0 9.2 8.2  Hemoglobin 12.0 - 15.0 g/dL 19.6 22.2 97.9  Hematocrit 36.0 - 46.0 % 36.3 42.0 43.7  Platelets 150 - 400 K/uL 356 422(H) 357   BMP Latest Ref Rng & Units 02/13/2020 02/08/2020 06/17/2016  Glucose 70 - 99 mg/dL 892(J) 194(R) 99  BUN 6 - 20 mg/dL 5(L) 12 11  Creatinine 0.44 - 1.00 mg/dL 7.40 8.14 4.81  Sodium 135 - 145 mmol/L 141 140 139  Potassium 3.5 - 5.1 mmol/L 2.2(LL) 4.0 4.2  Chloride 98 - 111 mmol/L 103 107 100(L)  CO2 22 - 32 mmol/L 27 21(L) 28  Calcium 8.9 - 10.3 mg/dL 8.5(U) 9.3 9.9    Intake/Output Summary (Last 24 hours) at 02/14/2020 0854 Last data filed at 02/13/2020 1500 Gross per 24 hour  Intake 750 ml  Output --  Net 750 ml    Current Facility-Administered Medications:  .  acetaminophen (TYLENOL) tablet 650 mg, 650 mg, Oral, Q4H PRN, 650 mg at 02/14/20 0647 **OR** acetaminophen (TYLENOL) 160 MG/5ML solution 650 mg, 650 mg, Per Tube, Q4H PRN **OR** acetaminophen (TYLENOL) suppository 650 mg, 650 mg, Rectal, Q4H PRN, Bergman, Meghan D, NP .  albuterol (PROVENTIL) (2.5 MG/3ML) 0.083% nebulizer solution 2.5 mg, 2.5 mg, Nebulization, BID, Lisbeth Renshaw, MD, 2.5 mg at 02/14/20 0815 .  albuterol (PROVENTIL) (2.5 MG/3ML) 0.083% nebulizer solution 2.5 mg, 2.5 mg, Nebulization, Q12H PRN, Lisbeth Renshaw, MD, 2.5 mg at 02/12/20 1613 .  Chlorhexidine Gluconate Cloth 2 % PADS 6 each, 6 each, Topical, Daily, Julio Sicks, MD, 6  each at 02/12/20 1200 .  docusate sodium (COLACE) capsule 100 mg, 100 mg, Oral, BID, Bergman, Meghan D, NP, 100 mg at 02/13/20 2203 .  furosemide (LASIX) injection 40 mg, 40 mg, Intravenous, Once, Saatvik Thielman A, MD .  heparin injection 5,000 Units, 5,000 Units, Subcutaneous, Q8H, Jadene Pierini, MD, 5,000 Units at 02/14/20 [phone] .  HYDROmorphone (DILAUDID) tablet 2 mg, 2 mg, Oral, Q3H PRN, Jadene Pierini, MD, 2 mg at 02/14/20 0759 .  MEDLINE mouth rinse, 15 mL, Mouth Rinse, BID, Lisbeth Renshaw, MD, 15 mL at 02/13/20 2204 .  menthol-cetylpyridinium (CEPACOL) lozenge 3 mg, 1 lozenge, Oral, PRN, Jadene Pierini, MD .  mupirocin ointment (BACTROBAN) 2 % 1 application, 1 application, Nasal, BID, Lisbeth Renshaw, MD, 1 application at 02/13/20 2202 .  niMODipine (NIMOTOP) capsule 60 mg, 60 mg, Oral, Q4H, 60 mg at 02/14/20 0635 **OR** niMODipine (NYMALIZE) 6 MG/ML oral solution 60 mg, 60 mg, Per Tube, Q4H, Bergman, Meghan D, NP, 60 mg at 02/11/20 0148 .  ondansetron (ZOFRAN-ODT) disintegrating tablet 4 mg, 4 mg, Oral, Q6H PRN **OR** ondansetron (ZOFRAN) injection 4 mg, 4 mg, Intravenous, Q6H PRN, Bergman, Meghan D, NP, 4 mg at 02/10/20 1000 .  pantoprazole (PROTONIX) EC tablet 40 mg, 40 mg, Oral, Daily, 40 mg at 02/13/20 1030 **OR** pantoprazole sodium (PROTONIX)  40 mg/20 mL oral suspension 40 mg, 40 mg, Per Tube, Daily, Bergman, Meghan D, NP, 40 mg at 02/09/20 1401 .  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  Neuro -headache improved today significantly -exam stable, rpt TCDs today  Cardiopulm -rpt CXR from yesterday shows stable pulmonary edema, stable O2 requirement, will continue diuresis  -continue nebs prn -respiratory insufficiency: continue high  flow [place], wean as tolerated, lasix x1 this morning  [name] -reg diet -repeat RFP p repleting K yesterday -no e/o volume depletion, neuro stable, will hold IVF & diurese  PPx/dispo -SCDs/TEDs/SQH, ambulate as tolerated -pantoprazole, nimodipine  Judith Part  02/14/20 8:54 AM

## 2020-02-14 NOTE — Progress Notes (Signed)
Transcranial Doppler  Date POD PCO2 HCT BP  MCA ACA PCA OPTH SIPH VERT Basilar  2/22 MS SAHday2  42 130/ 70 Right  Left   110  98   -49  -58   14  *   28  20   44  49   -48  -27   -58      2/24 MS    124/ 64 Right  Left   97  69   -42  -63   35  30   21  18    46  44   -29  -43   -44      2/26 GC     Right  Left   69  73   -23  -17   35  29   19  17    52  43   -40  -47   -41            Right  Left                                             Right  Left                                            Right  Left                                            Right  Left                                        MCA = Middle Cerebral Artery      OPHT = Opthalmic Artery     BASILAR = Basilar Artery   ACA = Anterior Cerebral Artery     SIPH = Carotid Siphon PCA = Posterior Cerebral Artery   VERT = Verterbral Artery                   Normal MCA = 62+\-12 ACA = 50+\-12 PCA = 42+\-23    02/10/2020- [place] ratio: Right= 3.55 Left= 2.45   *Not insonated   02/12/2020- [place] ratio: Right=3.73 Left= 2.16 02/12/2020, MHA, RVT, RDCS, RDMS

## 2020-02-14 NOTE — Progress Notes (Signed)
Physical Therapy Treatment Patient Details Name: Sabrina [name] MRN: [id number] DOB: [birth year] Today's Date: 02/14/2020    History of Present Illness Pt is a 49 yo female admitted with headache and nausea.  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 Coil placed.  Pt with h/o back pain, asthma, smoker.     PT Comments    Pt with much improved tolerance for ambulation this session, requiring x1 seated rest break during hallway ambulation to recover DOE 3/4 with sats 88% and above on 10LO2 via HFNC. PT and OT saw pt together for part of session to progress pt mobility and provide pt safety with +2 for hallway ambulation. PT to continue to follow acutely.    Follow Up Recommendations  Home health PT;Supervision/Assistance - 24 hour     Equipment Recommendations  None recommended by PT    Recommendations for Other Services       Precautions / Restrictions Precautions Precautions: Fall Precaution Comments: 10L HFNC Restrictions Weight Bearing Restrictions: No    Mobility  Bed Mobility               General bed mobility comments: sitting EOB upon arrival, with OT  Transfers Overall transfer level: Needs assistance Equipment used: 1 person hand held assist Transfers: Sit to/from Stand Sit to Stand: Min guard Stand pivot transfers: Min guard       General transfer comment: min guard for safety, pt with use of PT hand for self-steadying.  Ambulation/Gait Ambulation/Gait assistance: Min guard;Min assist Gait Distance (Feet): 100 Feet(2x100 ft in hallway) Assistive device: None Gait Pattern/deviations: Step-through pattern;Decreased stride length;Trunk flexed Gait velocity: slightly decr   General Gait Details: min guard for safety, pt generally unsteady and intermittently reaches for hallway railing to self-steady. SpO2 88-96% on 10LO2 via HFNC, HR WFL, RRmax 28   Stairs             Wheelchair Mobility    Modified Rankin (Stroke Patients Only)        Balance Overall balance assessment: Needs assistance Sitting-balance support: Feet supported Sitting balance-Leahy Scale: Good     Standing balance support: During functional activity;No upper extremity supported Standing balance-Leahy Scale: Fair                              Cognition Arousal/Alertness: Awake/alert Behavior During Therapy: WFL for tasks assessed/performed Overall Cognitive Status: Within Functional Limits for tasks assessed                                        Exercises      General Comments General comments (skin integrity, edema, etc.): BP 119/71 HR 93 RR 37 ( after ambulating back from BR). Pt on 10L HFNC with brief period of O2 87% needing seated rest break to rebound back to WNL.      Pertinent Vitals/Pain Pain Assessment: Faces Faces Pain Scale: Hurts a little bit Pain Location: head; general malaise Pain Descriptors / Indicators: Discomfort Pain Intervention(s): Limited activity within patient's tolerance;Monitored during session    Home Living                      Prior Function            PT Goals (current goals can now be found in the care plan section) Acute Rehab PT Goals Patient Stated  Goal: to get better PT Goal Formulation: With patient/family Time For Goal Achievement: 02/24/20 Potential to Achieve Goals: Good Progress towards PT goals: Progressing toward goals    Frequency    Min 4X/week      PT Plan Current plan remains appropriate    Co-evaluation PT/OT/SLP Co-Evaluation/Treatment: Yes   PT goals addressed during session: Mobility/safety with mobility        AM-PAC PT "6 Clicks" Mobility   Outcome Measure  Help needed turning from your back to your side while in a flat bed without using bedrails?: A Little Help needed moving from lying on your back to sitting on the side of a flat bed without using bedrails?: A Little Help needed moving to and from a bed to a chair  (including a wheelchair)?: A Little Help needed standing up from a chair using your arms (e.g., wheelchair or bedside chair)?: A Little Help needed to walk in hospital room?: A Little Help needed climbing 3-5 steps with a railing? : A Lot 6 Click Score: 17    End of Session Equipment Utilized During Treatment: Gait belt Activity Tolerance: Patient tolerated treatment well Patient left: in chair;with call bell/phone within reach;with family/visitor present Nurse Communication: Mobility status PT Visit Diagnosis: Unsteadiness on feet (R26.81);Other abnormalities of gait and mobility (R26.89);Muscle weakness (generalized) (M62.81);Difficulty in walking, not elsewhere classified (R26.2)     Time: 2993-7169 PT Time Calculation (min) (ACUTE ONLY): 18 min  Charges:  $Gait Training: 8-22 mins                    Amberli Ruegg E, PT [place] Pager [phone]  Office [phone]    Citlalic Norlander D Elonda Husky 02/14/2020, 3:17 PM

## 2020-02-14 NOTE — Progress Notes (Signed)
Occupational Therapy Treatment Patient Details Name: Sabrina [name] MRN: [id number] DOB: 1971-11-15 Today's Date: 02/14/2020    History of present illness Pt is a 49 yo female admitted with headache and nausea.  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 Coil placed.  Pt with h/o back pain, asthma, smoker.    OT comments  Pt making good progress towards OT goals this session. Pt reports needing to use BR upon OT arrival. Pt required min guard for line mgmt to ambulate EOB>BR. Pt completed standing grooming tasks with sup. Pt able to progress functional mobility distance with no AD with MIN Ai min guard for balance greater than a household distance. Initiated education on ECS to incorporate into ADL routine at home with pt verbalizing understanding. Continue to recommend HHOT at DC, will follow acutely per POC.   Follow Up Recommendations  Supervision/Assistance - 24 hour;Home health OT    Equipment Recommendations  None recommended by OT    Recommendations for Other Services      Precautions / Restrictions Precautions Precautions: Fall Precaution Comments: 10L HFNC Restrictions Weight Bearing Restrictions: No       Mobility Bed Mobility               General bed mobility comments: sitting EOB upon arrival  Transfers Overall transfer level: Needs assistance Equipment used: 1 person hand held assist Transfers: Sit to/from Stand   Stand pivot transfers: Min guard       General transfer comment: MIN guard for safety    Balance Overall balance assessment: Needs assistance Sitting-balance support: Feet supported Sitting balance-Leahy Scale: Good     Standing balance support: During functional activity;No upper extremity supported Standing balance-Leahy Scale: Fair                             ADL either performed or assessed with clinical judgement   ADL Overall ADL's : Needs assistance/impaired     Grooming: Wash/dry  face;Standing;Supervision/safety;Min guard Grooming Details (indicate cue type and reason): min guard- sup for line mgmt         Upper Body Dressing : Supervision/safety Upper Body Dressing Details (indicate cue type and reason): pt tying own gown with sup     Toilet Transfer: Min guard;Ambulation;Regular Teacher, adult education Details (indicate cue type and reason): min guard for assistance with line mgmt Toileting- Clothing Manipulation and Hygiene: Supervision/safety;Sit to/from Nurse, children's Details (indicate cue type and reason): pt reports tub shower at home and plans to use seat Functional mobility during ADLs: Min guard General ADL Comments: pt progressing well towards OT goals. Now on 10L HFNC and able to progress functional mobility to greater than a household distanc with min guard assist. pt completed toileting tasks and standing grooming tasks with min guard- sup for line mgmt. Initiated education on ECS with pt verbalizing understanding     Vision       Perception     Praxis      Cognition Arousal/Alertness: Awake/alert Behavior During Therapy: WFL for tasks assessed/performed Overall Cognitive Status: Within Functional Limits for tasks assessed                                          Exercises     Shoulder Instructions       General Comments BP 119/71 HR  93 RR 37 ( after ambulating back from BR). Pt on 10L HFNC with brief period of O2 87% needing seated rest break to rebound back to WNL.    Pertinent Vitals/ Pain       Pain Assessment: Faces Faces Pain Scale: Hurts a little bit Pain Location: head; general malaise Pain Descriptors / Indicators: Discomfort Pain Intervention(s): Monitored during session;Repositioned  Home Living                                          Prior Functioning/Environment              Frequency  Min 2X/week        Progress Toward Goals  OT Goals(current goals  can now be found in the care plan section)  Progress towards OT goals: Progressing toward goals  Acute Rehab OT Goals Patient Stated Goal: to get better OT Goal Formulation: With patient/family Time For Goal Achievement: 02/24/20 Potential to Achieve Goals: Good  Plan Discharge plan remains appropriate    Co-evaluation                 AM-PAC OT "6 Clicks" Daily Activity     Outcome Measure   Help from another person eating meals?: None Help from another person taking care of personal grooming?: None Help from another person toileting, which includes using toliet, bedpan, or urinal?: None Help from another person bathing (including washing, rinsing, drying)?: A Little Help from another person to put on and taking off regular upper body clothing?: None Help from another person to put on and taking off regular lower body clothing?: None 6 Click Score: 23    End of Session Equipment Utilized During Treatment: Gait belt;Oxygen;Other (comment)(10 L HFNC)  OT Visit Diagnosis: Unsteadiness on feet (R26.81);Dizziness and giddiness (R42);Pain   Activity Tolerance Patient tolerated treatment well   Patient Left in chair;with call bell/phone within reach;with chair alarm set   Nurse Communication Mobility status        Time: 9629-5284 OT Time Calculation (min): 28 min  Charges: OT General Charges $OT Visit: 1 Visit OT Treatments $Self Care/Home Management : 8-22 mins  Lanier Clam., COTA/L Acute Rehabilitation Services [phone]    Ihor Gully 02/14/2020, 11:46 AM

## 2020-02-15 ENCOUNTER — Inpatient Hospital Stay (HOSPITAL_COMMUNITY): Payer: PRIVATE HEALTH INSURANCE

## 2020-02-15 MED ORDER — POLYETHYLENE GLYCOL 3350 17 G PO PACK
17.0000 g | PACK | Freq: Every day | ORAL | Status: DC | PRN
  Administered 2020-02-15 – 2020-02-17 (×2): 17 g via ORAL
  Filled 2020-02-15 (×3): qty 1

## 2020-02-15 NOTE — Progress Notes (Signed)
Neurosurgery Service Progress Note  Subjective: No acute events overnight  Objective: Vitals:   02/15/20 0500 02/15/20 0600 02/15/20 0800 02/15/20 0828  BP: 108/73 (!) 87/51 (!) 84/65   Pulse: 74 73 65   Resp: (!) 21 15 14    Temp:   99 F (37.2 C)   TempSrc:   Axillary   SpO2: 97% 96% 96% 92%   Temp (24hrs), Avg:98.8 F (37.1 C), Min:98.1 F (36.7 C), Max:99.5 F (37.5 C)  CBC Latest Ref Rng & Units 02/13/2020 02/08/2020 06/17/2016  WBC 4.0 - 10.5 K/uL 10.0 9.2 8.2  Hemoglobin 12.0 - 15.0 g/dL 06/19/2016 78.6 76.7  Hematocrit 36.0 - 46.0 % 36.3 42.0 43.7  Platelets 150 - 400 K/uL 356 422(H) 357   BMP Latest Ref Rng & Units 02/14/2020 02/14/2020 02/13/2020  Glucose 70 - 99 mg/dL 02/15/2020) 470(J) 628(Z)  BUN 6 - 20 mg/dL 7 7 5(L)  Creatinine 662(H - 1.00 mg/dL 4.76 5.46 5.03  Sodium 135 - 145 mmol/L 141 141 141  Potassium 3.5 - 5.1 mmol/L 3.3(L) 3.2(L) 2.2(LL)  Chloride 98 - 111 mmol/L 103 102 103  CO2 22 - 32 mmol/L 27 26 27   Calcium 8.9 - 10.3 mg/dL 9.0 5.46) )    Intake/Output Summary (Last 24 hours) at 02/15/2020 1.2(X Last data filed at 02/14/2020 1800 Gross per 24 hour  Intake 600 ml  Output --  Net 600 ml    Current Facility-Administered Medications:  .  acetaminophen (TYLENOL) tablet 650 mg, 650 mg, Oral, Q4H PRN, 650 mg at 02/14/20 0647 **OR** acetaminophen (TYLENOL) 160 MG/5ML solution 650 mg, 650 mg, Per Tube, Q4H PRN **OR** acetaminophen (TYLENOL) suppository 650 mg, 650 mg, Rectal, Q4H PRN, Bergman, Meghan D, NP .  albuterol (PROVENTIL) (2.5 MG/3ML) 0.083% nebulizer solution 2.5 mg, 2.5 mg, Nebulization, BID, 02/16/2020, MD, 2.5 mg at 02/15/20 0826 .  albuterol (PROVENTIL) (2.5 MG/3ML) 0.083% nebulizer solution 2.5 mg, 2.5 mg, Nebulization, Q12H PRN, Lisbeth Renshaw, MD, 2.5 mg at 02/12/20 1613 .  Chlorhexidine Gluconate Cloth 2 % PADS 6 each, 6 each, Topical, Daily, Lisbeth Renshaw, MD, 6 each at 02/14/20 1808 .  docusate sodium (COLACE) capsule 100 mg, 100  mg, Oral, BID, Bergman, Meghan D, NP, 100 mg at 02/14/20 2202 .  heparin injection 5,000 Units, 5,000 Units, Subcutaneous, Q8H, 02/16/20, MD, 5,000 Units at 02/15/20 [phone] .  HYDROmorphone (DILAUDID) tablet 2 mg, 2 mg, Oral, Q3H PRN, 02/17/20, MD, 2 mg at 02/15/20 Jadene Pierini .  MEDLINE mouth rinse, 15 mL, Mouth Rinse, BID, 02/17/20, MD, 15 mL at 02/14/20 2202 .  menthol-cetylpyridinium (CEPACOL) lozenge 3 mg, 1 lozenge, Oral, PRN, 02/16/20, MD .  niMODipine (NIMOTOP) capsule 60 mg, 60 mg, Oral, Q4H, 60 mg at 02/15/20 0653 **OR** niMODipine (NYMALIZE) 6 MG/ML oral solution 60 mg, 60 mg, Per Tube, Q4H, Bergman, Meghan D, NP, 60 mg at 02/11/20 0148 .  ondansetron (ZOFRAN-ODT) disintegrating tablet 4 mg, 4 mg, Oral, Q6H PRN, 4 mg at 02/15/20 0114 **OR** ondansetron (ZOFRAN) injection 4 mg, 4 mg, Intravenous, Q6H PRN, Bergman, Meghan D, NP, 4 mg at 02/14/20 1441 .  pantoprazole (PROTONIX) EC tablet 40 mg, 40 mg, Oral, Daily, 40 mg at 02/14/20 1023 **OR** pantoprazole sodium (PROTONIX) 40 mg/20 mL oral suspension 40 mg, 40 mg, Per Tube, Daily, Bergman, Meghan D, NP, 40 mg at 02/09/20 1401 .  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  Neuro -headache improved today significantly -exam stable, TCDs WNL  Cardiopulm -CXR improving, O2 requirement decreasing, will hold off on more diuresis to until needed, given that she is in the vasospasm window -continue nebs prn  [name] -reg diet -repeat RFP in AM  PPx/dispo -SCDs/TEDs/SQH, ambulate as tolerated -pantoprazole, nimodipine  Judith Part  02/15/20 9:37 AM

## 2020-02-16 LAB — RENAL FUNCTION PANEL
Albumin: 3.1 g/dL — ABNORMAL LOW (ref 3.5–5.0)
Anion gap: 9 (ref 5–15)
BUN: 6 mg/dL (ref 6–20)
CO2: 31 mmol/L (ref 22–32)
Calcium: 9.2 mg/dL (ref 8.9–10.3)
Chloride: 100 mmol/L (ref 98–111)
Creatinine, Ser: 0.73 mg/dL (ref 0.44–1.00)
GFR calc Af Amer: >60 mL/min (ref >60)
GFR calc non Af Amer: >60 mL/min (ref >60)
Glucose, Bld: 101 mg/dL — ABNORMAL HIGH (ref 70–99)
Phosphorus: 5 mg/dL — ABNORMAL HIGH (ref 2.5–4.6)
Potassium: 3.7 mmol/L (ref 3.5–5.1)
Sodium: 140 mmol/L (ref 135–145)

## 2020-02-16 NOTE — Progress Notes (Signed)
Neurosurgery Service Progress Note  Subjective: No acute events overnight, respiratory status & headache subjectively improved  Objective: Vitals:   02/16/20 0300 02/16/20 0400 02/16/20 0500 02/16/20 0600  BP: 102/67 101/65 103/66 126/73  Pulse: 65 66 61 64  Resp: 13  15 16   Temp:  98.7 F (37.1 C)    TempSrc:  Axillary    SpO2: 98% 91% 97% 100%   Temp (24hrs), Avg:98.7 F (37.1 C), Min:98 F (36.7 C), Max:99.1 F (37.3 C)  CBC Latest Ref Rng & Units 02/13/2020 02/08/2020 06/17/2016  WBC 4.0 - 10.5 K/uL 10.0 9.2 8.2  Hemoglobin 12.0 - 15.0 g/dL 12.3 13.8 15.0  Hematocrit 36.0 - 46.0 % 36.3 42.0 43.7  Platelets 150 - 400 K/uL 356 422(H) 357   BMP Latest Ref Rng & Units 02/16/2020 02/14/2020 02/14/2020  Glucose 70 - 99 mg/dL 101(H) 109(H) 100(H)  BUN 6 - 20 mg/dL 6 7 7   Creatinine 0.44 - 1.00 mg/dL 0.73 0.77 0.92  Sodium 135 - 145 mmol/L 140 141 141  Potassium 3.5 - 5.1 mmol/L 3.7 3.3(L) 3.2(L)  Chloride 98 - 111 mmol/L 100 103 102  CO2 22 - 32 mmol/L 31 27 26   Calcium 8.9 - 10.3 mg/dL 9.2 9.0 8.6(L)    Intake/Output Summary (Last 24 hours) at 02/16/2020 0841 Last data filed at 02/16/2020 0000 Gross per 24 hour  Intake 1000 ml  Output --  Net 1000 ml    Current Facility-Administered Medications:  .  acetaminophen (TYLENOL) tablet 650 mg, 650 mg, Oral, Q4H PRN, 650 mg at 02/16/20 0532 **OR** acetaminophen (TYLENOL) 160 MG/5ML solution 650 mg, 650 mg, Per Tube, Q4H PRN **OR** acetaminophen (TYLENOL) suppository 650 mg, 650 mg, Rectal, Q4H PRN, Bergman, Meghan D, NP .  albuterol (PROVENTIL) (2.5 MG/3ML) 0.083% nebulizer solution 2.5 mg, 2.5 mg, Nebulization, BID, Consuella Lose, MD, 2.5 mg at 02/15/20 1931 .  albuterol (PROVENTIL) (2.5 MG/3ML) 0.083% nebulizer solution 2.5 mg, 2.5 mg, Nebulization, Q12H PRN, Consuella Lose, MD, 2.5 mg at 02/12/20 1613 .  Chlorhexidine Gluconate Cloth 2 % PADS 6 each, 6 each, Topical, Daily, Earnie Larsson, MD, 6 each at 02/14/20 1808 .   docusate sodium (COLACE) capsule 100 mg, 100 mg, Oral, BID, Bergman, Meghan D, NP, 100 mg at 02/15/20 2111 .  heparin injection 5,000 Units, 5,000 Units, Subcutaneous, Q8H, Oskar Cretella, Joyice Faster, MD, 5,000 Units at 02/16/20 0533 .  HYDROmorphone (DILAUDID) tablet 2 mg, 2 mg, Oral, Q3H PRN, Judith Part, MD, 2 mg at 02/16/20 0532 .  MEDLINE mouth rinse, 15 mL, Mouth Rinse, BID, Consuella Lose, MD, 15 mL at 02/15/20 2124 .  menthol-cetylpyridinium (CEPACOL) lozenge 3 mg, 1 lozenge, Oral, PRN, Judith Part, MD .  niMODipine (NIMOTOP) capsule 60 mg, 60 mg, Oral, Q4H, 60 mg at 02/16/20 0651 **OR** niMODipine (NYMALIZE) 6 MG/ML oral solution 60 mg, 60 mg, Per Tube, Q4H, Bergman, Meghan D, NP, 60 mg at 02/11/20 0148 .  ondansetron (ZOFRAN-ODT) disintegrating tablet 4 mg, 4 mg, Oral, Q6H PRN, 4 mg at 02/15/20 0114 **OR** ondansetron (ZOFRAN) injection 4 mg, 4 mg, Intravenous, Q6H PRN, Bergman, Meghan D, NP, 4 mg at 02/14/20 1441 .  pantoprazole (PROTONIX) EC tablet 40 mg, 40 mg, Oral, Daily, 40 mg at 02/15/20 1105 **OR** pantoprazole sodium (PROTONIX) 40 mg/20 mL oral suspension 40 mg, 40 mg, Per Tube, Daily, Bergman, Meghan D, NP, 40 mg at 02/09/20 1401 .  polyethylene glycol (MIRALAX / GLYCOLAX) packet 17 g, 17 g, Oral, Daily PRN, Judith Part, MD,  17 g at 02/15/20 2113 .  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  Neuro -headache improved today significantly -exam stable  Cardiopulm -O2 requirement decreasing, wean as tolerated, will hold off on more diuresis to until needed, given that she is in the vasospasm window -continue nebs prn  [name] -reg  diet  PPx/dispo -SCDs/TEDs/SQH, ambulate as tolerated -pantoprazole, nimodipine  Sabrina [name]  02/16/20 8:41 AM

## 2020-02-17 ENCOUNTER — Inpatient Hospital Stay (HOSPITAL_COMMUNITY): Payer: PRIVATE HEALTH INSURANCE

## 2020-02-17 DIAGNOSIS — I609 Nontraumatic subarachnoid hemorrhage, unspecified: Secondary | ICD-10-CM

## 2020-02-17 MED ORDER — BISACODYL 10 MG RE SUPP
10.0000 mg | Freq: Once | RECTAL | Status: AC
  Administered 2020-02-17: 10 mg via RECTAL
  Filled 2020-02-17: qty 1

## 2020-02-17 NOTE — Progress Notes (Signed)
Physical Therapy Treatment Patient Details Name: Sabrina [name] MRN: [id number] DOB: Dec 16, 1971 Today's Date: 02/17/2020    History of Present Illness Pt is a 49 yo female admitted with headache and nausea.  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 Coil placed.  Pt with h/o back pain, asthma, smoker.     PT Comments    Much improved balance and stamina.  Sats maintain between low to mid 90% and HR tachy to 132 bpm with activity.  DGI 20/24 show lower risk of falling.    Follow Up Recommendations  Supervision/Assistance - 24 hour;No PT follow up     Equipment Recommendations  None recommended by PT    Recommendations for Other Services       Precautions / Restrictions Precautions Precautions: Fall(minimal risk)    Mobility  Bed Mobility Overal bed mobility: Modified Independent                Transfers Overall transfer level: Needs assistance   Transfers: Sit to/from Stand Sit to Stand: Supervision            Ambulation/Gait Ambulation/Gait assistance: Supervision Gait Distance (Feet): 500 Feet Assistive device: None Gait Pattern/deviations: Step-through pattern;Decreased stride length Gait velocity: moderate Gait velocity interpretation: >2.62 ft/sec, indicative of community ambulatory General Gait Details: pt steady, but a little guarde, gait speed is limited to below age approp.  See DGI   Stairs Stairs: Yes   Stair Management: One rail Right;No rails;Alternating pattern;Forwards Number of Stairs: 10 General stair comments: generally safe with/without rails   Wheelchair Mobility    Modified Rankin (Stroke Patients Only) Modified Rankin (Stroke Patients Only) Modified Rankin: Slight disability     Balance Overall balance assessment: Needs assistance   Sitting balance-Leahy Scale: Good       Standing balance-Leahy Scale: Good                   Standardized Balance Assessment Standardized Balance Assessment : Dynamic Gait  Index   Dynamic Gait Index Level Surface: Normal Change in Gait Speed: Mild Impairment Gait with Horizontal Head Turns: Mild Impairment Gait with Vertical Head Turns: Normal Gait and Pivot Turn: Normal Step Over Obstacle: Mild Impairment Step Around Obstacles: Mild Impairment Steps: Normal Total Score: 20      Cognition Arousal/Alertness: Awake/alert Behavior During Therapy: WFL for tasks assessed/performed Overall Cognitive Status: Within Functional Limits for tasks assessed                                        Exercises      General Comments        Pertinent Vitals/Pain Pain Assessment: Faces Faces Pain Scale: Hurts little more Pain Location: back, medial hams Pain Descriptors / Indicators: Discomfort;Restless;Grimacing Pain Intervention(s): Monitored during session    Home Living                      Prior Function            PT Goals (current goals can now be found in the care plan section) Acute Rehab PT Goals Patient Stated Goal: to get better PT Goal Formulation: With patient/family Time For Goal Achievement: 02/24/20 Potential to Achieve Goals: Good Progress towards PT goals: Progressing toward goals    Frequency    Min 4X/week      PT Plan Current plan remains appropriate    Co-evaluation  AM-PAC PT "6 Clicks" Mobility   Outcome Measure  Help needed turning from your back to your side while in a flat bed without using bedrails?: None Help needed moving from lying on your back to sitting on the side of a flat bed without using bedrails?: None Help needed moving to and from a bed to a chair (including a wheelchair)?: None Help needed standing up from a chair using your arms (e.g., wheelchair or bedside chair)?: None Help needed to walk in hospital room?: None Help needed climbing 3-5 steps with a railing? : None 6 Click Score: 24    End of Session   Activity Tolerance: Patient tolerated  treatment well Patient left: in chair;with call bell/phone within reach;with family/visitor present Nurse Communication: Mobility status PT Visit Diagnosis: Unsteadiness on feet (R26.81);Other abnormalities of gait and mobility (R26.89)     Time: 8416-6063 PT Time Calculation (min) (ACUTE ONLY): 30 min  Charges:  $Gait Training: 8-22 mins $Therapeutic Activity: 8-22 mins                     02/17/2020  Jacinto Halim., PT Acute Rehabilitation Services [phone]  (pager) [phone]  (office)   Eliseo Gum Ova Meegan 02/17/2020, 12:21 PM

## 2020-02-17 NOTE — Progress Notes (Signed)
  NEUROSURGERY PROGRESS NOTE   No issues overnight.  Biggest concern is lack of BM. HA manageable. Has not taken pain meds since 2300 yesterday. No new N/T/W. No dyspnea.  EXAM:  BP 123/68   Pulse 68   Temp 98.9 F (37.2 C) (Oral)   Resp 17   SpO2 100%   Awake, alert, oriented  Speech fluent, appropriate  CN grossly intact  5/5 BUE/BLE  No drift   Date POD PCO2 HCT BP  MCA ACA PCA OPTH SIPH VERT Basilar  2/22 MS SAHday2  42 130/ 70 Right  Left   110  98   -49  -58   14  *   28  20   44  49   -48  -27   -58      2/24 MS    124/ 64 Right  Left   97  69   -42  -63   35  30   21  18    46  44   -29  -43   -44      2/26 GC     Right  Left   69  73   -23  -17   35  29   19  17    52  43   -40  -47   -41       IMPRESSION/PLAN 49 y.o. female POD #8 coiling ACOM aneurysm.  Neurologically intact.  - continue nimotop, supportive care. TCDs today - constipation: suppository ordered at patient request. Mag citrate if needed. - dyspnea: currently denies. Using O2 as necessary. CXR today. Low threshold to consult PCCM.

## 2020-02-17 NOTE — Progress Notes (Signed)
Transcranial Doppler  Date POD PCO2 HCT BP  MCA ACA PCA OPTH SIPH VERT Basilar  2/22 MS SAHday2  42 130/ 70 Right  Left   110  98   -49  -58   14  *   28  20   44  49   -48  -27   -58      2/24 MS    124/ 64 Right  Left   97  69   -42  -63   35  30   21  18    46  44   -29  -43   -44      2/26 GC     Right  Left   69  73   -23  -17   35  29   19  17    52  43   -40  -47   -41       3/1 MS     Right  Left   88  80   -39  -81   79  30   18  16   30   50   -57  -47   -57            Right  Left                                            Right  Left                                            Right  Left                                        MCA = Middle Cerebral Artery      OPHT = Opthalmic Artery     BASILAR = Basilar Artery   ACA = Anterior Cerebral Artery     SIPH = Carotid Siphon PCA = Posterior Cerebral Artery   VERT = Verterbral Artery                   Normal MCA = 62+\-12 ACA = 50+\-12 PCA = 42+\-23    02/10/2020- Lindegaard ratio: Right= 3.55 Left= 2.45   *Not insonated   02/12/2020- Lindegaard ratio: Right=3.73 Left= 2.16 02/17/2020- Lindegaard ratio: Right=2.93 Left= 2.16  02/17/2020 1:39 PM 02/14/2020, MHA, RVT, RDCS, RDMS

## 2020-02-17 NOTE — Progress Notes (Signed)
Occupational Therapy Treatment Patient Details Name: Sabrina [name] MRN: [id number] DOB: [birth year] Today's Date: 02/17/2020    History of present illness Pt is a 49 yo female admitted with headache and nausea.  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 Coil placed.  Pt with h/o back pain, asthma, smoker.    OT comments  Pt making steady progress towards OT goals this session. Pt completed higher level balance task where pt was instructed to gather items from various surface levels. Pt completed task with no AD and sup for safety however pt reports back pain during task returning self to supine. Pts cognition appears to be improving however pt aware of STM deficits but noted to recall 3/3 words stated at start of session. Education provided on compensatory strategies for STM deficits with pt appreciative of education. Agree with DC plan below,will follow acutely.    Follow Up Recommendations  Supervision/Assistance - 24 hour;Home health OT    Equipment Recommendations  None recommended by OT    Recommendations for Other Services      Precautions / Restrictions Precautions Precautions: Fall Restrictions Weight Bearing Restrictions: No       Mobility Bed Mobility Overal bed mobility: Modified Independent Bed Mobility: Supine to Sit;Sit to Supine     Supine to sit: Modified independent (Device/Increase time) Sit to supine: Modified independent (Device/Increase time)   General bed mobility comments: no assist needed, + rail  Transfers Overall transfer level: Needs assistance Equipment used: None Transfers: Sit to/from Stand Sit to Stand: Supervision         General transfer comment: sup for safety    Balance Overall balance assessment: Needs assistance Sitting-balance support: Feet supported Sitting balance-Leahy Scale: Good     Standing balance support: During functional activity;No upper extremity supported Standing balance-Leahy Scale: Good Standing balance  comment: pt able to gather items on floor and various other surface levels with no LOB however pt reports increased back pain from task                           ADL either performed or assessed with clinical judgement   ADL Overall ADL's : Needs assistance/impaired                     Lower Body Dressing: Supervision/safety;Sitting/lateral leans Lower Body Dressing Details (indicate cue type and reason): to don shoes EOB Toilet Transfer: Supervision/safety;Ambulation Toilet Transfer Details (indicate cue type and reason): simulated via functional mobility with sup for safety when reaching out of BOS         Functional mobility during ADLs: Supervision/safety General ADL Comments: pt making good progress towards OT goals, now on RA with sats >90% during session. Pt HR increase to 127 bpm with mobility. Pt limited by back pain this session reporting 10/10 during dynamic balance task returning self to supine     Vision       Perception     Praxis      Cognition Arousal/Alertness: Awake/alert Behavior During Therapy: WFL for tasks assessed/performed Overall Cognitive Status: Within Functional Limits for tasks assessed                                 General Comments: pt cognition continues to improve, able to state 3/3 words mentioned in beginning of session and able to complete word finding tasks during standing balance challenge. pt cognizant  of STM deficits. provided compensatory strategies to accomodate for deficit        Exercises     Shoulder Instructions       General Comments pt HR increase to 127 bpm with functional mobility; pt on RA with O2 sats >90%. pts husband present during session    Pertinent Vitals/ Pain       Pain Assessment: Faces Faces Pain Scale: Hurts whole lot Pain Location: low back Pain Descriptors / Indicators: Discomfort;Restless;Grimacing Pain Intervention(s): Monitored during session;Repositioned  Home  Living                                          Prior Functioning/Environment              Frequency  Min 2X/week        Progress Toward Goals  OT Goals(current goals can now be found in the care plan section)  Progress towards OT goals: Progressing toward goals  Acute Rehab OT Goals Patient Stated Goal: to get better OT Goal Formulation: With patient/family Time For Goal Achievement: 02/24/20 Potential to Achieve Goals: Good  Plan Discharge plan remains appropriate    Co-evaluation                 AM-PAC OT "6 Clicks" Daily Activity     Outcome Measure   Help from another person eating meals?: None Help from another person taking care of personal grooming?: None Help from another person toileting, which includes using toliet, bedpan, or urinal?: None Help from another person bathing (including washing, rinsing, drying)?: A Little Help from another person to put on and taking off regular upper body clothing?: None Help from another person to put on and taking off regular lower body clothing?: None 6 Click Score: 23    End of Session    OT Visit Diagnosis: Unsteadiness on feet (R26.81);Dizziness and giddiness (R42);Pain   Activity Tolerance Patient tolerated treatment well   Patient Left in bed;with call bell/phone within reach;with family/visitor present   Nurse Communication          Time: 6962-9528 OT Time Calculation (min): 32 min  Charges: OT General Charges $OT Visit: 1 Visit OT Treatments $Therapeutic Activity: 23-37 mins  Audery Amel., COTA/L Acute Rehabilitation Services [phone]    Angelina Pih 02/17/2020, 4:25 PM

## 2020-02-18 MED ORDER — METHOCARBAMOL 500 MG PO TABS
750.0000 mg | ORAL_TABLET | Freq: Four times a day (QID) | ORAL | Status: DC
  Administered 2020-02-18 – 2020-02-19 (×5): 750 mg via ORAL
  Filled 2020-02-18 (×5): qty 2

## 2020-02-18 MED ORDER — DEXAMETHASONE SODIUM PHOSPHATE 4 MG/ML IJ SOLN
4.0000 mg | Freq: Four times a day (QID) | INTRAMUSCULAR | Status: DC
  Administered 2020-02-18 – 2020-02-19 (×5): 4 mg via INTRAVENOUS
  Filled 2020-02-18 (×5): qty 1

## 2020-02-18 NOTE — TOC Initial Note (Signed)
Transition of Care [facility]) - Initial/Assessment Note    Patient Details  Name: Sabrina [name] MRN: [id number] Date of Birth: 1971/04/23  [facility]) CM/SW Contact:    Glennon Mac, RN Phone Number: 02/18/2020, 4:22 PM  Clinical Narrative:   Pt is a 49 yo female admitted with headache and nausea.  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 Coil placed.  PTA, pt independent, lives with spouse.  PT/OT recommending HH follow up.   MD/PA:  Please leave orders for HHPT/OT and send discharge Rx to [facility] to be filled.  Nimotop generally is not in stock at retail pharmacies, but [facility] will have available.  Will need to do prior auth for Nimotop Rx:  Number is [phone].  Thanks!                Expected Discharge Plan: Home w Home Health Services Barriers to Discharge: Continued Medical Work up   Patient Goals and CMS Choice   CMS Medicare.gov Compare Post Acute Care list provided to:: Patient Choice offered to / list presented to : Patient  Expected Discharge Plan and Services Expected Discharge Plan: Home w Home Health Services   Discharge Planning Services: CM Consult, Medication Assistance Post Acute Care Choice: Home Health Living arrangements for the past 2 months: Single Family Home                                      Prior Living Arrangements/Services Living arrangements for the past 2 months: Single Family Home Lives with:: Spouse Patient language and need for interpreter reviewed:: Yes Do you feel safe going back to the place where you live?: Yes      Need for Family Participation in Patient Care: Yes (Comment) Care giver support system in place?: Yes (comment)   Criminal Activity/Legal Involvement Pertinent to Current Situation/Hospitalization: No - Comment as needed  Activities of Daily Living Home Assistive Devices/Equipment: Dentures (specify type) ADL Screening (condition at time of admission) Patient's  cognitive ability adequate to safely complete daily activities?: Yes Is the patient deaf or have difficulty hearing?: No Does the patient have difficulty seeing, even when wearing glasses/contacts?: No Does the patient have difficulty concentrating, remembering, or making decisions?: No Patient able to express need for assistance with ADLs?: Yes Does the patient have difficulty dressing or bathing?: No Independently performs ADLs?: Yes (appropriate for developmental age) Does the patient have difficulty walking or climbing stairs?: No Weakness of Legs: None Weakness of Arms/Hands: None  Permission Sought/Granted                  Emotional Assessment Appearance:: Appears stated age Attitude/Demeanor/Rapport: Engaged Affect (typically observed): Appropriate Orientation: : Oriented to Place, Oriented to  Time, Oriented to Self, Oriented to Situation Alcohol / Substance Use: Illicit Drugs Psych Involvement: No (comment)  Admission diagnosis:  Subarachnoid hemorrhage (HCC) [I60.9] SAH (subarachnoid hemorrhage) (HCC) [I60.9] Patient Active Problem List   Diagnosis Date Noted  . Subarachnoid hemorrhage (HCC) 02/08/2020  . Irregular menses 03/15/2017   PCP:  Dorothey Baseman, MD Pharmacy:   [facility] [address] [place] - [address] [place] [address] Phone: [phone] Fax: [phone]  [facility] [address] [place] - [address] [place] [address] Phone: [phone] Fax: [phone]     Social Determinants of Health (SDOH) Interventions  Readmission Risk Interventions No flowsheet data found.   Reinaldo Raddle, RN, BSN  Trauma/Neuro ICU Case Manager [phone]

## 2020-02-18 NOTE — TOC Benefit Eligibility Note (Signed)
Transition of Care [facility]) Benefit Eligibility Note    Patient Details  Name: Sabrina [name] MRN: [id number] Date of Birth: 19-May-1971   Medication/Dose: NIMODIPINE   30 MG  Q4H PO/30 DAY SUPPLY  Covered?: Yes  Tier: (NO TIER)  Prescription Coverage Preferred Pharmacy: [facility]  and   [facility]  Spoke with Person/Company/Phone Number:: MARK @ [facility] [employer] # [phone] OPT- MEMBER  Co-Pay: $20.00  Prior Approval: No  Deductible: Unmet  Additional Notes: NIMOTOP  60 MG Q4H PO/30 DAY SUPPLY . NON-FORMULARY , P/A  YES #  [phone]    Mardene Sayer Phone Number: 02/18/2020, 3:12 PM

## 2020-02-18 NOTE — Progress Notes (Signed)
Physical Therapy Treatment Patient Details Name: Sabrina [name] MRN: [id number] DOB: December 19, 1971 Today's Date: 02/18/2020    History of Present Illness Pt is a 49 yo female admitted with headache and nausea.  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 Coil placed.  Pt with h/o back pain, asthma, smoker.     PT Comments    Pt showing improvements.  She will not need follow up.  Awaiting d/c.    Follow Up Recommendations  Supervision/Assistance - 24 hour;No PT follow up     Equipment Recommendations  None recommended by PT    Recommendations for Other Services       Precautions / Restrictions Precautions Precautions: Fall    Mobility  Bed Mobility Overal bed mobility: Modified Independent                Transfers Overall transfer level: Modified independent                  Ambulation/Gait Ambulation/Gait assistance: Supervision;Modified independent (Device/Increase time) Gait Distance (Feet): 400 Feet Assistive device: None(carrying the tele-monitor) Gait Pattern/deviations: Step-through pattern   Gait velocity interpretation: >2.62 ft/sec, indicative of community ambulatory General Gait Details: Improved steadiness, less guarded, increased speed and able to scan, abruptly change direction and back up without deviation today   Stairs             Wheelchair Mobility    Modified Rankin (Stroke Patients Only) Modified Rankin (Stroke Patients Only) Pre-Morbid Rankin Score: No significant disability Modified Rankin: Slight disability     Balance     Sitting balance-Leahy Scale: Good       Standing balance-Leahy Scale: Good                              Cognition Arousal/Alertness: Awake/alert Behavior During Therapy: WFL for tasks assessed/performed Overall Cognitive Status: Within Functional Limits for tasks assessed                                        Exercises      General Comments General  comments (skin integrity, edema, etc.): sats on RA 96% and above, tachy to 129 bpm      Pertinent Vitals/Pain Pain Assessment: Faces Faces Pain Scale: Hurts little more Pain Location: low back Pain Descriptors / Indicators: Discomfort;Grimacing;Guarding Pain Intervention(s): Premedicated before session;Monitored during session    Home Living Family/patient expects to be discharged to:: Private residence Living Arrangements: Spouse/significant other                  Prior Function            PT Goals (current goals can now be found in the care plan section) Acute Rehab PT Goals Patient Stated Goal: to get better PT Goal Formulation: With patient/family Time For Goal Achievement: 02/24/20 Potential to Achieve Goals: Good Progress towards PT goals: Progressing toward goals    Frequency    Min 3X/week      PT Plan Current plan remains appropriate;Frequency needs to be updated    Co-evaluation              AM-PAC PT "6 Clicks" Mobility   Outcome Measure  Help needed turning from your back to your side while in a flat bed without using bedrails?: None Help needed moving from lying on your back to sitting  on the side of a flat bed without using bedrails?: None Help needed moving to and from a bed to a chair (including a wheelchair)?: None Help needed standing up from a chair using your arms (e.g., wheelchair or bedside chair)?: None Help needed to walk in hospital room?: None Help needed climbing 3-5 steps with a railing? : None 6 Click Score: 24    End of Session   Activity Tolerance: Patient tolerated treatment well Patient left: in bed;with call bell/phone within reach;with family/visitor present Nurse Communication: Mobility status PT Visit Diagnosis: Unsteadiness on feet (R26.81)     Time: 1208-1229 PT Time Calculation (min) (ACUTE ONLY): 21 min  Charges:  $Gait Training: 8-22 mins                     02/18/2020  Jacinto Halim., PT Acute  Rehabilitation Services [phone]  (pager) [phone]  (office)   Eliseo Gum Idaliz Tinkle 02/18/2020, 4:36 PM

## 2020-02-18 NOTE — Progress Notes (Signed)
Pt c/o exacerbation of chronic back pain. Explained plan of care discussed w/ Vinny, PA about discontinuing PO dilaudid. Offered pt tylenol, repositioning, ambulating in room, transferring from bed to chair, ice/heat, biofreeze. Pt refused any of the offered measures to alleviate pain, states "she feels that RN is accusing her of drug seeking and that all the offered measures are useless." This RN explained that staff is willing to help in any way to alleviate her pain, yet pt still refused measures offered. Vinny, [place] aware and will place orders for muscle relaxer and steroid.

## 2020-02-18 NOTE — Progress Notes (Signed)
  NEUROSURGERY PROGRESS NOTE   No issues overnight.  Minimal HA.  Had BM yesterday. Mostly complains of back pain Back pain: chronic. Sees Dr Otelia Sergeant. No surgical options. Manages with massage therapy as needed, herbal supplements. Exacerbated right now due to laying flat.  EXAM:  BP 121/81   Pulse 92   Temp 98.9 F (37.2 C) (Oral)   Resp 12   SpO2 93%   Awake, alert, oriented  Speech fluent, appropriate  CN grossly intact  5/5 BUE/BLE  No drift  Date POD PCO2 HCT BP  MCA ACA PCA OPTH SIPH VERT Basilar  2/22 MS SAHday2  42 130/ 70 Right  Left   110  98   -49  -58   14  *   28  20   44  49   -48  -27   -58      2/24 MS    124/ 64 Right  Left   97  69   -42  -63   35  30   21  18    46  44   -29  -43   -44      2/26 GC     Right  Left   69  73   -23  -17   35  29   19  17    52  43   -40  -47   -41       3/1 MS     Right  Left   88  80   -39  -81   79  30   18  16   30   50   -57  -47   -57        IMPRESSION/PLAN 49 y.o. female POD #9 coiling ACOM aneurysm.  Neurologically intact. Acute on chronic pain - continue nimotop, supportive care. TCDs look okay. - Back pain: d/c dilaudid. Decadron, robaxin. - dyspnea: currently denies. Using O2 as necessary. CXR looks good. Hx asthma/COPD.

## 2020-02-19 ENCOUNTER — Inpatient Hospital Stay (HOSPITAL_COMMUNITY): Payer: PRIVATE HEALTH INSURANCE

## 2020-02-19 DIAGNOSIS — I609 Nontraumatic subarachnoid hemorrhage, unspecified: Secondary | ICD-10-CM

## 2020-02-19 MED ORDER — METHYLPREDNISOLONE 4 MG PO TBPK: ORAL_TABLET | ORAL | 0 refills | Status: AC

## 2020-02-19 MED ORDER — NIMODIPINE 30 MG PO CAPS: 60.0000 mg | ORAL_CAPSULE | ORAL | 0 refills | Status: AC

## 2020-02-19 MED ORDER — METHOCARBAMOL 750 MG PO TABS: 750.0000 mg | ORAL_TABLET | Freq: Three times a day (TID) | ORAL | 1 refills | Status: AC | PRN

## 2020-02-19 MED FILL — niMODipine 30 MG CAPS: 30 | 10 days supply | Qty: 120 | Fill #0

## 2020-02-19 MED FILL — METHYLPREDNISOLONE 4 MG DOS: 4 | 6 days supply | Qty: 21 | Fill #0

## 2020-02-19 MED FILL — METHOCARBAMOL 750 MG TABS: 750 | 30 days supply | Qty: 90 | Fill #0

## 2020-02-19 NOTE — Progress Notes (Signed)
Transcranial Doppler  Date POD PCO2 HCT BP  MCA ACA PCA OPTH [facility] VERT Basilar  2/22 MS SAHday2  42 130/ 70 Right  Left   110  98   -49  -58   14  *   28  20   44  49   -48  -27   -58      2/24 MS    124/ 64 Right  Left   97  69   -42  -63   35  30   21  18    46  44   -29  -43   -44      2/26 GC     Right  Left   69  73   -23  -17   35  29   19  17    52  43   -40  -47   -41       3/1 MS     Right  Left   88  80   -39  -81   79  30   18  16   30   50   -57  -47   -57       3/3 MR     Right  Left   99  89   -28  -20   35  43   19  19   46  41   -26  -29   -50           Right  Left                                            Right  Left                                        MCA = Middle Cerebral Artery      OPHT = Opthalmic Artery     BASILAR = Basilar Artery   ACA = Anterior Cerebral Artery     SIPH = Carotid Siphon PCA = Posterior Cerebral Artery   VERT = Verterbral Artery                   Normal MCA = 62+\-12 ACA = 50+\-12 PCA = 42+\-23    02/10/2020- Lindegaard ratio: Right= 3.55 Left= 2.45   *Not insonated   02/12/2020- Lindegaard ratio: Right=3.73 Left= 2.16 02/17/2020- Lindegaard ratio: Right=2.93 Left= 2.16  Delane Wessinger 02/19/2020 11:46 AM

## 2020-02-19 NOTE — Progress Notes (Addendum)
  NEUROSURGERY PROGRESS NOTE   No issues overnight.  Minimal HA Back pain improved by 80%  EXAM:  BP (!) 139/93   Pulse (!) 104   Temp 97.7 F (36.5 C) (Oral)   Resp 14   SpO2 98%   Awake, alert, oriented  Speech fluent, appropriate  CN grossly intact  5/5 BUE/BLE   IMPRESSION/PLAN 49 y.o. female POD #10 coiling ACOM aneurysm. Neurologically intact.  - TCDs today. If stable, d/c home - orders ready for anticipated discharge  addendum TCDs reviewed and look good. DC home

## 2020-02-19 NOTE — TOC Transition Note (Signed)
Transition of Care [facility]) - CM/SW Discharge Note   Patient Details  Name: Sabrina [name] MRN: [id number] Date of Birth: 1971-06-20  Transition of Care [facility]) CM/SW Contact:  Glennon Mac, RN Phone Number: 02/19/2020, 2:18 PM   Clinical Narrative: Pt medically stable for discharge home today with spouse.  DC rx sent to [facility] pharmacy to be filled, and delivered to pt.  PT/OT now recommending no OP follow up; no HH services needed.        Final next level of care: Home/Self Care Barriers to Discharge: Barriers Resolved   Patient Goals and CMS Choice   CMS Medicare.gov Compare Post Acute Care list provided to:: Patient Choice offered to / list presented to : Patient                        Discharge Plan and Services   Discharge Planning Services: CM Consult, Medication Assistance Post Acute Care Choice: Home Health                               Social Determinants of Health (SDOH) Interventions     Readmission Risk Interventions No flowsheet data found.  Quintella Baton, RN, BSN  Trauma/Neuro ICU Case Manager [phone]

## 2020-02-19 NOTE — Progress Notes (Signed)
Patient provided with AVS documentation, reviewed all discharge instructions and answered all questions of patient and husband. Instructed to f/u with Dr. Conchita Paris in 3 weeks outpatient. All LDAs removed and personal belongings returned.  Aris Lot RN

## 2020-02-19 NOTE — Progress Notes (Signed)
Occupational Therapy Treatment Patient Details Name: Sabrina [name] MRN: [id number] DOB: [birth year] Today's Date: 02/19/2020    History of present illness Pt is a 49 yo female admitted with headache and nausea.  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 Coil placed.  Pt with h/o back pain, asthma, smoker.    OT comments  Pt making good progress towards OT goals this session. Pt complete full shower with supervision assist. Overall, MOD I for UB/ LB bathing from shower seat in walkin shower. Pt with good safety awareness noted to be able to dry floor before ambulating on wet surface. Pt anxiously awaiting potential DC home today. Feel pt likely reaching baseline of function. Agree with DC plan below, will follow acutely for OT needs.    Follow Up Recommendations  Supervision/Assistance - 24 hour;Home health OT    Equipment Recommendations  None recommended by OT    Recommendations for Other Services      Precautions / Restrictions Restrictions Weight Bearing Restrictions: No       Mobility Bed Mobility Overal bed mobility: Modified Independent Bed Mobility: Supine to Sit     Supine to sit: Modified independent (Device/Increase time)     General bed mobility comments: no assist needed, + rail  Transfers Overall transfer level: Modified independent Equipment used: None Transfers: Sit to/from Stand Sit to Stand: Modified independent (Device/Increase time)              Balance Overall balance assessment: Needs assistance Sitting-balance support: Feet supported Sitting balance-Leahy Scale: Good     Standing balance support: During functional activity;No upper extremity supported Standing balance-Leahy Scale: Good Standing balance comment: able to sit<>stand several times in shower to complete LB bathing with no LOB                           ADL either performed or assessed with clinical judgement   ADL Overall ADL's : Needs assistance/impaired         Upper Body Bathing: Modified independent;Sitting   Lower Body Bathing: Modified independent;Sitting/lateral leans   Upper Body Dressing : Set up;Sitting   Lower Body Dressing: Modified independent;Sit to/from stand Lower Body Dressing Details (indicate cue type and reason): to don shoes EOB Toilet Transfer: Modified Independent;Ambulation;Grab bars Toilet Transfer Details (indicate cue type and reason): use of grab bars noted Toileting- Clothing Manipulation and Hygiene: Modified independent;Sitting/lateral lean   Tub/ Shower Transfer: Ambulation;Shower seat;Walk-in shower;Modified independent Tub/Shower Transfer Details (indicate cue type and reason): shower seat in walkin shower Functional mobility during ADLs: Supervision/safety General ADL Comments: pt likely nearing baseline level of function able to complete full shower MOD I sitting on shower seat in walk in shower.  Pt reports improving back pain and hopeful DC home today     Vision       Perception     Praxis      Cognition Arousal/Alertness: Awake/alert Behavior During Therapy: WFL for tasks assessed/performed Overall Cognitive Status: Within Functional Limits for tasks assessed                                          Exercises     Shoulder Instructions       General Comments VSS on RA Pt HR increase to 120 after shower.     Pertinent Vitals/ Pain       Pain  Assessment: No/denies pain  Home Living                                          Prior Functioning/Environment              Frequency  Min 2X/week        Progress Toward Goals  OT Goals(current goals can now be found in the care plan section)  Progress towards OT goals: Progressing toward goals  Acute Rehab OT Goals Patient Stated Goal: to get better OT Goal Formulation: With patient/family Time For Goal Achievement: 02/24/20 Potential to Achieve Goals: Good  Plan Discharge plan remains  appropriate    Co-evaluation                 AM-PAC OT "6 Clicks" Daily Activity     Outcome Measure   Help from another person eating meals?: None Help from another person taking care of personal grooming?: None   Help from another person bathing (including washing, rinsing, drying)?: A Little Help from another person to put on and taking off regular upper body clothing?: None Help from another person to put on and taking off regular lower body clothing?: None 6 Click Score: 19    End of Session    OT Visit Diagnosis: Unsteadiness on feet (R26.81);Dizziness and giddiness (R42);Pain   Activity Tolerance Patient tolerated treatment well   Patient Left in bed;with call bell/phone within reach   Nurse Communication Mobility status        Time: 7654-6503 OT Time Calculation (min): 41 min  Charges: OT General Charges $OT Visit: 1 Visit OT Treatments $Self Care/Home Management : 38-52 mins  Lanier Clam., COTA/L Acute Rehabilitation Services [phone]   Ihor Gully 02/19/2020, 9:43 AM

## 2020-02-19 NOTE — Discharge Summary (Signed)
Physician Discharge Summary  Patient ID: Sabrina [name] MRN: [id number] DOB/AGE: 03-24-1971 49 y.o.  Admit date: 02/08/2020 Discharge date: 02/19/2020  Admission Diagnoses:  Ruptured ACOM aneurysm SAH Acute on chronic low back pain Asthma, dyspnea constipation  Discharge Diagnoses:  Same Active Problems:   Subarachnoid hemorrhage [facility])  Discharged Condition: Stable  Hospital Course:  Sabrina [name] is a 49 y.o. female Who presented to the emergency room on 02/08/2020 with severe headache.  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  She was neurologically intact and admitted to the neuro ICU for further management and treatment.    She underwent coil embolization of ruptured A-comm aneurysm on 02/09/2020 by Dr. Conchita Paris.   With the exception of acute on chronic back pain improved with steroids, constipation relieved with enema and asthmatic flare improved with oxygen as needed, she had an uncomplicated hospital course.  She remained neurologically intact throughout her hospital stay.  She took and a top as instructed for vasospasm prevention.  Serial T CDs were obtained which did not show any evidence of vasospasm.    She worked with Physical and Occupational therapy.  It was recommended when she was discharged to go home with home health therapy.  On postop day 10, subarachnoid hemorrhage day 11 patient was discharged home in hemodynamically stable condition.  She will complete 21 day course of Nimotop. She will f/u in 2-3 weeks.  Treatments: Surgery Diagnostic cerebral angiogram, coil embolization of Acom aneurysm   Discharge Exam: Blood pressure (!) 139/93, pulse (!) 104, temperature 97.7 F (36.5 C), temperature source Oral, resp. rate 14, SpO2 98 %. Awake, alert, oriented Speech fluent, appropriate CN grossly intact 5/5 BUE/BLE Wound c/d/i  Disposition:  Home  Discharge Instructions    Call MD for:  difficulty breathing, headache or visual disturbances   Complete by: As directed    Call MD for:  difficulty breathing, headache or visual disturbances   Complete by: As directed    Call MD for:  persistant dizziness or light-headedness   Complete by: As directed    Call MD for:  persistant dizziness or light-headedness   Complete by: As directed    Call MD for:  redness, tenderness, or signs of infection (pain, swelling, redness, odor or green/yellow discharge around incision site)   Complete by: As directed    Call MD for:  redness, tenderness, or signs of infection (pain, swelling, redness, odor or green/yellow discharge around incision site)   Complete by: As directed    Call MD for:  severe uncontrolled pain   Complete by: As directed    Call MD for:  severe uncontrolled pain   Complete by: As directed    Call MD for:  temperature >100.4   Complete by: As directed    Call MD for:  temperature >100.4   Complete by: As directed    Diet general   Complete by: As directed    Diet general   Complete by: As directed    Driving Restrictions   Complete by: As directed    Do not drive until given clearance.   Driving Restrictions   Complete by: As directed    Do not drive until given clearance.   Increase activity slowly   Complete by: As directed    Increase activity slowly   Complete by: As directed    Lifting restrictions   Complete by: As directed    Do  not lift anything >10lbs. Avoid bending and twisting in awkward positions. Avoid bending at the back.   Lifting restrictions   Complete by: As directed    Do not lift anything >10lbs. Avoid bending and twisting in awkward positions. Avoid bending at the back.   May shower / Bathe   Complete by: As directed    In 24 hours. Okay to wash wound with warm soapy water. Avoid scrubbing the wound. Pat dry.   Remove dressing in 24 hours   Complete by: As directed      Allergies as  of 02/19/2020      Reactions   Clarithromycin Nausea And Vomiting   Codeine Hives, Nausea And Vomiting   Codeine Rash, Other (See Comments)   Abdominal pain      Medication List    STOP taking these medications   HYDROcodone-acetaminophen 5-325 MG tablet Commonly known as: NORCO/VICODIN     TAKE these medications   albuterol 108 (90 Base) MCG/ACT inhaler Commonly known as: VENTOLIN HFA Inhale 2 puffs into the lungs every 6 (six) hours as needed for wheezing or shortness of breath.   methocarbamol 750 MG tablet Commonly known as: Robaxin-750 Take 1 tablet (750 mg total) by mouth 3 (three) times daily as needed for muscle spasms. What changed:   medication strength  how much to take  when to take this  reasons to take this   methylPREDNISolone 4 MG Tbpk tablet Commonly known as: Medrol Take according to package insert   niMODipine 30 MG capsule Commonly known as: NIMOTOP Take 2 capsules (60 mg total) by mouth every 4 (four) hours for 10 days.      Follow-up Information    Consuella Lose, MD. Schedule an appointment as soon as possible for a visit in 3 week(s).   Specialty: Neurosurgery Contact information: [address] [place] [address] [phone]           Signed: Traci Sermon 02/19/2020, 9:39 AM

## 2020-03-04 ENCOUNTER — Encounter: Payer: Self-pay | Admitting: *Deleted

## 2020-03-04 ENCOUNTER — Other Ambulatory Visit: Payer: Self-pay | Admitting: *Deleted

## 2020-03-04 NOTE — Patient Outreach (Signed)
[facility]) Care Management  03/04/2020  Sabrina [name] Sep 02, 1971 [id number]   EMMI- stroke NOT on APL RED ON EMMI ALERT Day # 9 Date:  Saturday 02/29/20 10 am stroke coping -patient Red Alert Reason: Feeling worse overall? Yes Sad, hopeless, anxious, or empty? Yes  Insurance:  Self pay [facility] admissions x 1 ED visits x 1 in the last 6 months  Last [place] admission 02/08/20 to 02/19/20 for ruptured A-COMM aneurysm, SAH,  Recommended outpatient therapy after discharge  Transition of care services noted to be completed by primary care MD office staff - Dr Juluis Pitch, [facility] clinic [place]  Transition of Care will be completed by primary care provider office who will refer to [facility] care management if needed.  Outreach attempt # 1 successful to the home number  Patient is able to verify HIPAA, DOB and address [place] Management RN reviewed and addressed red alert with patient   EMMI:  Sabrina [name] reports the EMMI responses were correct  She informs [facility] RN CM, "Saturday was a tough day" She states "sometimes I just get sad. I have some personal issues with my daughter/" " I was in my emotions on a woo as me side" She reports there are some things she is trying to get over. She states even with all her concerns "I am blessed" PHQ 2 total score =1  She was informed about [facility] SW services that could assist with resources if needed Support was provided At this time she denies need of [facility] SW services but voiced appreciation of the offer  She informs [facility] RN CM has "quit smoking" and her appetite is good because she is eating more now that she is not smoking  She reports after her surgery she has to be careful with sudden movements  She reports some headaches the only stay "three to five minutes" but are resolved She and [facility] RN CM reviewed worsening symptoms to report to  Dr Kathyrn Sheriff, surgeon to include persistent dizziness, persistent headaches for long  durations, sensitivity to light or visual disturbances. [facility] RN CM referred her also to her after visit summary  From 02/19/20  She is noted not to be alone at home as children voices are heard in the background during this call [facility] RN CM offered [facility] RN CM office number and the [facility] 24 hour nurse call center number to call at any time she has symptoms she is questioning Gave   Recommended pcp hospital follow up and discussed the importance of primary care provider (PCP) needing to be aware of changes in her treatment plan, medical status and to be able to assist if other orders are needed the specialist is not able to provide. She voiced understanding and states she will contact the pcp for schedule a hospital follow up appointment] as she has been home for 2 weeks   Social: Sabrina [name] is a 49 year old married self employed patient who lives with her husband (husband is a Administrator out of state at times) She is independent with all her care needs and gets assistance from her husband, Shanon Brow and other family for some  IADLs (instrumental Activities of Daily Living) needs and transportation prn   Conditions: Ruptured A-COMM aneurysm, Subarachnoid hemorrhage [facility]), status post (s/p) coil embolization on 02/09/20 by Dr Kathyrn Sheriff Acute on chronic low back pain, dyspnea asthma, constipation, irregular menses, fibromyalgia, current every day smoker, UDS positive for [facility]  DME: dentures  Medications: She denies concerns with taking medications as prescribed, affording medications, side effects of medications and questions about medications    Advance Directives: Denies need for assist with advance directives   Consent: [facility] RN CM reviewed [facility] services with patient. Patient gave verbal consent for services [facility] telephonic RN CM.   Advised patient that there will be further automated EMMI- post discharge calls to assess how the patient is doing following the recent hospitalization Advised  the patient that another call may be received from a nurse if any of their responses were abnormal. Patient voiced understanding and was appreciative of f/u call.   Plan: [facility] RN CM will follow up with Sabrina Hart [place] within the next 7-10 business days Pt encouraged to return a call to [facility] RN CM prn Routed note to MD   Cala Bradford L. Noelle Penner, RN, BSN, CCM [facility] Telephonic Care Management Care Coordinator Office number [phone] Mobile number [phone]  Main [facility] number [phone] Fax number [phone]

## 2020-03-05 ENCOUNTER — Other Ambulatory Visit: Payer: Self-pay | Admitting: *Deleted

## 2020-03-05 NOTE — Patient Outreach (Signed)
[facility]) Care Management  03/05/2020  Sabrina [name] 16-Aug-1971 [id number]   EMMI- stroke NOT on APL RED ON EMMI ALERT Day # 13 Date:  Wednesday 03/04/20 1301stroke after follow up -patient Red Alert Reason: Sabrina [name] to follow-up appointment? No  Insurance:  Self pay [facility] admissions x 1 ED visits x 1 in the last 6 months  Last [place] admission 02/08/20 to 02/19/20 for ruptured A-COMM aneurysm, SAH,  Recommended outpatient therapy after discharge  Transition of care services noted to be completed by primary care MD office staff - Dr Dorothey Baseman, [facility] clinic [place]  Transition of Care will be completed by primary care provider office who will refer to [facility] care management if needed.  [facility] RN CM spoke with Sabrina [name] on 03/04/20 1148 after the above EMMI outreach She and [facility] RN CM discussed follow up appointments  She reported having a follow up appointment with Dr Conchita Paris "next week" but not her primary care provider (PCP) as of 03/04/20   Recommended pcp hospital follow up and discussed the importance of primary care provider (PCP) needing to be aware of changes in her treatment plan, medical status and to be able to assist if other orders are needed the specialist is not able to provide. She voiced understanding and states she will contact the pcp for schedule a hospital follow up appointment] as she has been home for 2 weeks  Social: Sabrina [name] is a 49 year old married self employed patient who lives with her husband (husband is a Naval architect out of state at times) She is independent with all her care needs and gets assistance from her husband, Onalee Hua and other family for some  IADLs (instrumental Activities of Daily Living) needs and transportation prn   Conditions: Ruptured A-COMM aneurysm, Subarachnoid hemorrhage [facility] status post (s/p) coil embolization on 02/09/20 by Dr Conchita Paris Acute on chronic low back  pain, dyspnea asthma, constipation, irregular menses, fibromyalgia, current every day smoker, UDS positive for [facility]  DME: dentures  Plan: [facility] RN CM will follow up with Sabrina Hart [place] within the next 7-10 business days Pt encouraged to return a call to [facility] RN CM prn    Cala Bradford L. Noelle Penner, RN, BSN, CCM [facility] Telephonic Care Management Care Coordinator Office number [phone] Mobile number [phone]  Main [facility] number [phone] Fax number [phone]

## 2020-03-11 ENCOUNTER — Other Ambulatory Visit: Payer: Self-pay | Admitting: *Deleted

## 2020-03-11 ENCOUNTER — Other Ambulatory Visit: Payer: Self-pay

## 2020-03-11 NOTE — Patient Outreach (Addendum)
[facility]) Care Management  03/11/2020  Sabrina [name] July 20, 1971 [id number]   EMMI- stroke NOT on APL RED ON EMMI ALERT Day #13 Date:Wednesday 03/04/20 1301 stroke after follow up -patient Red Alert Reason: Sabrina [name] to follow-up appointment? No  Insurance:Self pay -[employer] if [place] insurance coverage She states it was related to [employer] [facility] admissions x1ED visits x 1 inthe last 6 months  Last [place] admission 02/08/20 to 02/19/20 for ruptured A-COMM aneurysm, SAH,  Recommended outpatient therapy after discharge  Transition of care services noted to be completed by primary care MD office staff- Dr Dorothey Baseman, [facility] clinic [place] Transition of Care will be completed by primary care provider office who will refer to [facility] care management if needed.   EMMI follow up  Sabrina [name] confirms she was going to her specialist follow up on today at 1345 with Dr Conchita Paris pcp hospital follow up- She reports she did confirm she did call her primary care provider (PCP) to get a follow up appointment that is scheduled for Monday 03/16/2020  [facility] RN CM discussed if Sabrina [name] was monitoring her blood pressure (BP) when she mentioned having noted headache and ringing in her ear when riding in a car  She continues to deny falls but states she has "stumbled She was encourage to report these symptoms to Dr Conchita Paris on today  [facility] RN CM encouraged her to see if her [employer] coverage offered over the counter benefit program to obtain a BP cuff or trying a local discount store for a cost efficient BP cuff. [facility] RN CM checked the [employer] on line site for her but was not able to find an over the counter program. She was notified  She discussed receiving a $911 bill. 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  EKC:[id number]  Plan: [facility] RN CM will close case at this time as patient has been assessed and no needs identified/needs resolved.  Pt encouraged to return a call to [facility] RN CM prn .  Routed note to MDs/NP/PA    Cala Bradford L. Noelle Penner, RN, BSN, CCM [facility] Telephonic Care Management Care Coordinator Office number [phone] Mobile number [phone]  Main [facility] number [phone] Fax number [phone]

## 2020-07-28 ENCOUNTER — Other Ambulatory Visit: Payer: Self-pay | Admitting: Neurosurgery

## 2020-07-28 DIAGNOSIS — I609 Nontraumatic subarachnoid hemorrhage, unspecified: Secondary | ICD-10-CM

## 2020-08-10 ENCOUNTER — Other Ambulatory Visit: Payer: Self-pay | Admitting: Neurosurgery

## 2020-08-21 ENCOUNTER — Other Ambulatory Visit (HOSPITAL_COMMUNITY): Payer: PRIVATE HEALTH INSURANCE

## 2020-08-25 ENCOUNTER — Encounter (HOSPITAL_COMMUNITY): Payer: Self-pay

## 2020-08-25 ENCOUNTER — Inpatient Hospital Stay (HOSPITAL_COMMUNITY): Admission: RE | Admit: 2020-08-25 | Payer: PRIVATE HEALTH INSURANCE | Source: Ambulatory Visit

## 2020-10-27 ENCOUNTER — Other Ambulatory Visit: Payer: Self-pay | Admitting: Family Medicine

## 2020-10-27 DIAGNOSIS — Z1231 Encounter for screening mammogram for malignant neoplasm of breast: Secondary | ICD-10-CM

## 2020-10-28 ENCOUNTER — Other Ambulatory Visit: Payer: Self-pay | Admitting: Neurosurgery

## 2020-10-28 DIAGNOSIS — I609 Nontraumatic subarachnoid hemorrhage, unspecified: Secondary | ICD-10-CM

## 2020-11-17 ENCOUNTER — Other Ambulatory Visit (HOSPITAL_COMMUNITY): Payer: PRIVATE HEALTH INSURANCE

## 2020-11-20 ENCOUNTER — Other Ambulatory Visit (HOSPITAL_COMMUNITY): Payer: PRIVATE HEALTH INSURANCE

## 2020-11-24 ENCOUNTER — Other Ambulatory Visit: Payer: Self-pay | Admitting: Neurosurgery

## 2020-11-24 DIAGNOSIS — I609 Nontraumatic subarachnoid hemorrhage, unspecified: Secondary | ICD-10-CM

## 2020-11-26 NOTE — H&P (Signed)
Chief Complaint   No chief complaint on file.   HPI   HPI: Sabrina [name] is a 49 y.o. 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  She has made an excellent recovery.  She presents today for routine follow-up diagnostic cerebral angiogram for continued surveillance.  She is without any concerns.  Patient Active Problem List   Diagnosis Date Noted  . Subarachnoid hemorrhage (HCC) 02/08/2020  . Irregular menses 03/15/2017    PMH: Past Medical History:  Diagnosis Date  . Asthma in remission   . Back pain, chronic   . Fibromyalgia     PSH: Past Surgical History:  Procedure Laterality Date  . BREAST BIOPSY Right   . IR ANGIO INTRA EXTRACRAN SEL INTERNAL CAROTID BILAT MOD SED  02/09/2020  . IR ANGIO VERTEBRAL SEL VERTEBRAL BILAT MOD SED  02/09/2020  . IR ANGIOGRAM FOLLOW UP STUDY  02/09/2020  . IR ANGIOGRAM FOLLOW UP STUDY  02/09/2020  . IR ANGIOGRAM FOLLOW UP STUDY  02/09/2020  . IR NEURO EACH ADD'L AFTER BASIC UNI RIGHT (MS)  02/09/2020  . IR TRANSCATH/EMBOLIZ  02/09/2020  . RADIOLOGY WITH ANESTHESIA N/A 02/09/2020   Procedure: RADIOLOGY WITH ANESTHESIA;  Surgeon: Lisbeth Renshaw, MD;  Location: [facility] OR;  Service: Radiology;  Laterality: N/A;    (Not in a hospital admission)   SH: Social History   Tobacco Use  . Smoking status: Current Every Day Smoker  . Smokeless tobacco: Never Used  Substance Use Topics  . Alcohol use: No  . Drug use: No    MEDS: Prior to Admission medications   Medication Sig Start Date End Date Taking? Authorizing Provider  albuterol (PROVENTIL HFA;VENTOLIN HFA) 108 (90 BASE) MCG/ACT inhaler Inhale 2 puffs into the lungs every 6 (six) hours as needed for wheezing or shortness of breath.    [provider]  methocarbamol (ROBAXIN-750) 750 MG tablet Take 1 tablet (750 mg total) by mouth 3 (three) times daily as needed for muscle spasms.  02/19/20   Costella, Darci Current, PA-C  methylPREDNISolone (MEDROL) 4 MG TBPK tablet Take according to package insert 02/19/20   Costella, Darci Current, PA-C  niMODipine (NIMOTOP) 30 MG capsule Take 2 capsules (60 mg total) by mouth every 4 (four) hours for 10 days. 02/19/20 02/29/20  Costella, Darci Current, PA-C    ALLERGY: Allergies  Allergen Reactions  . Clarithromycin Nausea And Vomiting  . Codeine Hives and Nausea And Vomiting  . Codeine Rash and Other (See Comments)    Abdominal pain    Social History   Tobacco Use  . Smoking status: Current Every Day Smoker  . Smokeless tobacco: Never Used  Substance Use Topics  . Alcohol use: No     Family History  Problem Relation Age of Onset  . Diabetes Other   . Hypertension Other   . Breast cancer Mother 55  . Pancreatic cancer Maternal Aunt   . Bone cancer Maternal Uncle   . Cancer Maternal Grandmother   . Breast cancer Maternal Grandmother 50  . Colon cancer Maternal Grandfather   . Bone cancer Paternal Aunt      ROS   ROS  Exam   There were no vitals filed for this visit. General appearance: WDWN, NAD Eyes: No scleral injection Cardiovascular: Regular rate and rhythm without murmurs, rubs, gallops. No edema or variciosities. Distal pulses normal. Pulmonary: Effort normal, non-labored breathing Musculoskeletal:     Muscle tone upper extremities:  Normal    Muscle tone lower extremities: Normal    Motor exam: Upper Extremities Deltoid Bicep Tricep Grip  Right 5/5 5/5 5/5 5/5  Left 5/5 5/5 5/5 5/5   Lower Extremity IP Quad PF DF EHL  Right 5/5 5/5 5/5 5/5 5/5  Left 5/5 5/5 5/5 5/5 5/5   Neurological Mental Status:    - Patient is awake, alert, oriented to person, place, month, year, and situation    - Patient is able to give a clear and coherent history.    - No signs of aphasia or neglect Cranial Nerves    - II: Visual Fields are full. PERRL    - III/IV/VI: EOMI without ptosis or diploplia.     - V: Facial sensation is  grossly normal    - VII: Facial movement is symmetric.     - VIII: hearing is intact to voice    - X: Uvula elevates symmetrically    - XI: Shoulder shrug is symmetric.    - XII: tongue is midline without atrophy or fasciculations.  Sensory: Sensation grossly intact to LT Results - Imaging/Labs   No results found for this or any previous visit (from the past 48 hour(s)).  No results found.  Impression/Plan   49 y.o. 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  She is doing well clinically.  We have reviewed the indications for surgery, the associated risks, benefits and alternatives at length in the office.  All questions today were answered and consent was obtained.  Lisbeth Renshaw, MD [facility]

## 2020-11-27 ENCOUNTER — Other Ambulatory Visit: Payer: Self-pay

## 2020-11-27 ENCOUNTER — Ambulatory Visit (HOSPITAL_COMMUNITY)
Admission: RE | Admit: 2020-11-27 | Discharge: 2020-11-27 | Disposition: A | Discharge status: Home / Self Care | Payer: PRIVATE HEALTH INSURANCE | Source: Ambulatory Visit | Attending: Neurosurgery | Admitting: Neurosurgery

## 2020-11-27 ENCOUNTER — Other Ambulatory Visit: Payer: Self-pay | Admitting: Neurosurgery

## 2020-11-27 DIAGNOSIS — I609 Nontraumatic subarachnoid hemorrhage, unspecified: Secondary | ICD-10-CM

## 2020-11-27 DIAGNOSIS — F17200 Nicotine dependence, unspecified, uncomplicated: Secondary | ICD-10-CM | POA: Insufficient documentation

## 2020-11-27 DIAGNOSIS — Z8679 Personal history of other diseases of the circulatory system: Secondary | ICD-10-CM | POA: Insufficient documentation

## 2020-11-27 DIAGNOSIS — I671 Cerebral aneurysm, nonruptured: Secondary | ICD-10-CM | POA: Insufficient documentation

## 2020-11-27 DIAGNOSIS — Z881 Allergy status to other antibiotic agents status: Secondary | ICD-10-CM | POA: Diagnosis not present

## 2020-11-27 DIAGNOSIS — Z79899 Other long term (current) drug therapy: Secondary | ICD-10-CM | POA: Insufficient documentation

## 2020-11-27 DIAGNOSIS — Z885 Allergy status to narcotic agent status: Secondary | ICD-10-CM | POA: Insufficient documentation

## 2020-11-27 HISTORY — PX: IR ANGIO INTRA EXTRACRAN SEL INTERNAL CAROTID UNI R MOD SED: IMG5362

## 2020-11-27 LAB — CBC WITH DIFFERENTIAL/PLATELET
Abs Immature Granulocytes: 0.01 10*3/uL (ref 0.00–0.07)
Basophils Absolute: 0.1 10*3/uL (ref 0.0–0.1)
Basophils Relative: 1 %
Eosinophils Absolute: 0.3 10*3/uL (ref 0.0–0.5)
Eosinophils Relative: 4 %
HCT: 40.7 % (ref 36.0–46.0)
Hemoglobin: 13.8 g/dL (ref 12.0–15.0)
Immature Granulocytes: 0 %
Lymphocytes Relative: 36 %
Lymphs Abs: 2.4 10*3/uL (ref 0.7–4.0)
MCH: 30.7 pg (ref 26.0–34.0)
MCHC: 33.9 g/dL (ref 30.0–36.0)
MCV: 90.4 fL (ref 80.0–100.0)
Monocytes Absolute: 0.4 10*3/uL (ref 0.1–1.0)
Monocytes Relative: 6 %
Neutro Abs: 3.5 10*3/uL (ref 1.7–7.7)
Neutrophils Relative %: 53 %
Platelets: 457 10*3/uL — ABNORMAL HIGH (ref 150–400)
RBC: 4.5 MIL/uL (ref 3.87–5.11)
RDW: 13.7 % (ref 11.5–15.5)
WBC: 6.6 10*3/uL (ref 4.0–10.5)
nRBC: 0 % (ref 0.0–0.2)

## 2020-11-27 LAB — BASIC METABOLIC PANEL
Anion gap: 12 (ref 5–15)
BUN: 8 mg/dL (ref 6–20)
CO2: 24 mmol/L (ref 22–32)
Calcium: 9.7 mg/dL (ref 8.9–10.3)
Chloride: 105 mmol/L (ref 98–111)
Creatinine, Ser: 0.81 mg/dL (ref 0.44–1.00)
GFR, Estimated: >60 mL/min (ref >60)
Glucose, Bld: 108 mg/dL — ABNORMAL HIGH (ref 70–99)
Potassium: 4.1 mmol/L (ref 3.5–5.1)
Sodium: 141 mmol/L (ref 135–145)

## 2020-11-27 LAB — URINALYSIS, ROUTINE W REFLEX MICROSCOPIC
Bilirubin Urine: NEGATIVE
Glucose, UA: NEGATIVE mg/dL
Hgb urine dipstick: NEGATIVE
Ketones, ur: NEGATIVE mg/dL
Leukocytes,Ua: NEGATIVE
Nitrite: NEGATIVE
Protein, ur: NEGATIVE mg/dL
Specific Gravity, Urine: 1.017 (ref 1.005–1.030)
pH: 6 (ref 5.0–8.0)

## 2020-11-27 LAB — APTT: aPTT: 32 seconds (ref 24–36)

## 2020-11-27 LAB — PROTIME-INR
INR: 1 (ref 0.8–1.2)
Prothrombin Time: 12.4 seconds (ref 11.4–15.2)

## 2020-11-27 MED ORDER — LIDOCAINE HCL 1 % IJ SOLN
INTRAMUSCULAR | Status: AC
  Filled 2020-11-27: qty 20

## 2020-11-27 MED ORDER — HEPARIN SODIUM (PORCINE) 1000 UNIT/ML IJ SOLN
INTRAMUSCULAR | Status: AC | PRN
  Administered 2020-11-27: 2000 [IU] via INTRAVENOUS

## 2020-11-27 MED ORDER — HYDROCODONE-ACETAMINOPHEN 5-325 MG PO TABS: 1.0000 | ORAL_TABLET | ORAL | Status: DC | PRN

## 2020-11-27 MED ORDER — CEFAZOLIN SODIUM-DEXTROSE 2-4 GM/100ML-% IV SOLN: 2.0000 g | INTRAVENOUS | Status: DC

## 2020-11-27 MED ORDER — SODIUM CHLORIDE 0.9 % IV SOLN: INTRAVENOUS | Status: DC

## 2020-11-27 MED ORDER — MIDAZOLAM HCL 2 MG/2ML IJ SOLN
INTRAMUSCULAR | Status: AC | PRN
  Administered 2020-11-27: 1 mg via INTRAVENOUS

## 2020-11-27 MED ORDER — CHLORHEXIDINE GLUCONATE CLOTH 2 % EX PADS: 6.0000 | MEDICATED_PAD | Freq: Once | CUTANEOUS | Status: DC

## 2020-11-27 MED ORDER — IOHEXOL 300 MG/ML  SOLN
100.0000 mL | Freq: Once | INTRAMUSCULAR | Status: AC | PRN
  Administered 2020-11-27: 20 mL via INTRA_ARTERIAL

## 2020-11-27 MED ORDER — MIDAZOLAM HCL 2 MG/2ML IJ SOLN
INTRAMUSCULAR | Status: AC
  Filled 2020-11-27: qty 2

## 2020-11-27 MED ORDER — FENTANYL CITRATE (PF) 100 MCG/2ML IJ SOLN
INTRAMUSCULAR | Status: AC | PRN
  Administered 2020-11-27: 25 ug via INTRAVENOUS

## 2020-11-27 MED ORDER — HEPARIN SODIUM (PORCINE) 1000 UNIT/ML IJ SOLN
INTRAMUSCULAR | Status: AC
  Filled 2020-11-27: qty 1

## 2020-11-27 MED ORDER — FENTANYL CITRATE (PF) 100 MCG/2ML IJ SOLN
INTRAMUSCULAR | Status: AC
  Filled 2020-11-27: qty 2

## 2020-11-27 NOTE — Sedation Documentation (Signed)
Handoff with Darreld Mclean, RN at [phone] with groin site a level 0. Groin site closed with a 5 fr exoseal at 1212, pressure released at 1218.

## 2020-11-27 NOTE — Discharge Instructions (Addendum)
Femoral Site Care This sheet gives you information about how to care for yourself after your procedure. Your health care provider may also give you more specific instructions. If you have problems or questions, contact your health care provider. What can I expect after the procedure? After the procedure, it is common to have:  Bruising that usually fades within 1-2 weeks.  Tenderness at the site. Follow these instructions at home: Wound care  Follow instructions from your health care provider about how to take care of your insertion site. Make sure you: ? Wash your hands with soap and water before you change your bandage (dressing). If soap and water are not available, use hand sanitizer. ? Change your dressing as told by your health care provider. ? Leave stitches (sutures), skin glue, or adhesive strips in place. These skin closures may need to stay in place for 2 weeks or longer. If adhesive strip edges start to loosen and curl up, you may trim the loose edges. Do not remove adhesive strips completely unless your health care provider tells you to do that.  Do not take baths, swim, or use a hot tub until your health care provider approves.  You may shower 24-48 hours after the procedure or as told by your health care provider. ? Gently wash the site with plain soap and water. ? Pat the area dry with a clean towel. ? Do not rub the site. This may cause bleeding.  Do not apply powder or lotion to the site. Keep the site clean and dry.  Check your femoral site every day for signs of infection. Check for: ? Redness, swelling, or pain. ? Fluid or blood. ? Warmth. ? Pus or a bad smell. Activity  For the first 2-3 days after your procedure, or as long as directed: ? Avoid climbing stairs as much as possible. ? Do not squat.  Do not lift anything that is heavier than 10 lb (4.5 kg), or the limit that you are told, until your health care provider says that it is safe.  Rest as  directed. ? Avoid sitting for a long time without moving. Get up to take short walks every 1-2 hours.  Do not drive for 24 hours if you were given a medicine to help you relax (sedative). General instructions  Take over-the-counter and prescription medicines only as told by your health care provider.  Keep all follow-up visits as told by your health care provider. This is important. Contact a health care provider if you have:  A fever or chills.  You have redness, swelling, or pain around your insertion site. Get help right away if:  The catheter insertion area swells very fast.  You pass out.  You suddenly start to sweat or your skin gets clammy.  The catheter insertion area is bleeding, and the bleeding does not stop when you hold steady pressure on the area.  The area near or just beyond the catheter insertion site becomes pale, cool, tingly, or numb. These symptoms may represent a serious problem that is an emergency. Do not wait to see if the symptoms will go away. Get medical help right away. Call your local emergency services (911 in the [place] Do not drive yourself to the hospital. Summary  After the procedure, it is common to have bruising that usually fades within 1-2 weeks.  Check your femoral site every day for signs of infection.  Do not lift anything that is heavier than 10 lb (4.5 kg), or the   limit that you are told, until your health care provider says that it is safe. This information is not intended to replace advice given to you by your health care provider. Make sure you discuss any questions you have with your health care provider. Document Revised: 12/18/2017 Document Reviewed: 12/18/2017 Elsevier Patient Education  2020 Elsevier Inc. Cerebral Angiogram  A cerebral angiogram is a procedure that is used to examine the blood vessels in the brain and neck. Contrast dye is injected through a thin tube (catheter) into an artery. X-ray pictures are then taken.  The pictures can show an abnormality in a blood vessel, such as a blockage, narrowing (stenosis), or bulging (aneurysm). Tell a health care provider about:  Any allergies you have, including allergies to medicines, shellfish, contrast dye, or iodine.  All medicines you are taking, including vitamins, herbs, eye drops, creams, and over-the-counter medicines.  Any blood thinning medicines you take, such as aspirin or warfarin.  Any problems you or family members have had with anesthetic medicines.  Any blood disorders you have.  Any surgeries you have had.  Any medical conditions you have or have had, including kidney problems or kidney failure.  Whether you are pregnant or may be pregnant, or are breastfeeding. What are the risks? Generally, this is a safe procedure. However, problems may occur, including:  Problems in the insertion site, such as bleeding, bruising, infection, or collection of blood under the skin (hematoma).  Allergic reaction to medicines or dyes.  Damage to nearby structures or organs, including blood vessels or arteries. Also, contrast dye can damage the kidneys.  Blood clot.  Weakness, numbness, speech, or vision problems. This is usually temporary.  Stroke.  Amnesia, or being unable to remember what happened (rare). This is temporary. What happens before the procedure? Staying hydrated Follow instructions from your healthcare provider about hydration, which may include:  Up to 2 hours before the procedure - you may continue to drink clear liquids, such as water, clear fruit juice, black coffee, and plain tea. Eating and drinking restrictions Follow instructions from your health care provider about eating and drinking, which may include:  8 hours before the procedure - stop eating heavy meals or foods, such as meat, fried foods, or fatty foods.  6 hours before the procedure - stop eating light meals or foods, such as toast or cereal.  6 hours before  the procedure - stop drinking milk or drinks that contain milk.  2 hours before the procedure - stop drinking clear liquids. Medicines Ask your health care provider about:  Changing or stopping your regular medicines. This is especially important if you are taking diabetes medicines or blood thinners.  Taking medicines such as aspirin and ibuprofen. These medicines can thin your blood. Do not take these medicines unless your health care provider tells you to take them.  Taking over-the-counter medicines, vitamins, herbs, and supplements. General instructions  Do not use any products that contain nicotine or tobacco for at least 4 weeks before the procedure. These products include cigarettes, e-cigarettes, and chewing tobacco. If you need help quitting, ask your health care provider.  You may have blood tests done.  Plan to have someone take you home from the hospital or clinic.  If you will be going home the same day of the procedure, plan to have someone with you for 24 hours.  Ask your health care provider what steps will be taken to prevent infection. These may include: ? Removing hair at the insertion   site. ? Washing skin with a germ-killing soap. ? Taking antibiotic medicine. What happens during the procedure?  You will lie on an X-ray table. Your head and legs may be strapped down.  An IV will be inserted into one of your veins.  You will be given one or both of the following: ? A medicine to help you relax (sedative). ? A medicine to numb the area (local anesthetic) where the catheter will be inserted, usually in your groin, leg, or arm.  Your heart rate and other vital signs will be watched carefully. Electrodes may be placed on your chest.  A small incision will be made. The catheter will be moved through the incision up to the blood vessels in your neck and brain.  Dye will be injected into the catheter and will travel to the blood vessels of the brain and neck. You  may notice a warm feeling or strange taste in your mouth.  You will be asked to lie still. X-rays will be taken to show the flow of the dye through the blood vessels in the brain and neck.  If an abnormality is found in a blood vessel, another procedure may be done to treat the problem.  Tell your health care provider if you develop chest pain or trouble breathing during the procedure.  When the images are finished, the catheter will be removed. Pressure will be applied to stop bleeding.  A closure device may be placed into the access site to form a seal. The seal stops bleeding and helps the artery heal.  A bandage (dressing)will be applied over the small opening in the skin.  Your IV will be removed. The procedure may vary among health care providers and hospitals. What happens after the procedure?  Your blood pressure, heart rate, breathing rate, and blood oxygen level will be monitored until you leave the hospital or clinic.  You will be asked to lie flat for several hours. You will keep the limb where the catheter was inserted straight.  The insertion site and the pulse in your foot or wrist will be checked often.  You will be told to drink plenty of fluids. This will help flush the contrast dye out of your system.  Do not drive for 24 hours if you were given a sedative during your procedure.  It is up to you to get the results of your procedure. Ask your health care provider, or the department that is doing the procedure, when your results will be ready. Summary  A cerebral angiogram is a procedure that checks the health of the blood vessels in the brain and neck.  You will be given a sedativeand a local anesthetic. You may feel pressure when the catheter is inserted and warmth when the dye is injected.  Contrast dye is injected through a catheter into an artery. X-rays are taken to look for an abnormality, such as blockage or narrowing.  After the procedure, you will be  asked to lie flat for several hours. Do not drive for 24 hours, or until a health care provider tells you to. This information is not intended to replace advice given to you by your health care provider. Make sure you discuss any questions you have with your health care provider. Document Revised: 06/25/2019 Document Reviewed: 06/25/2019 Elsevier Patient Education  2020 Elsevier Inc. Moderate Conscious Sedation, Adult Sedation is the use of medicines to promote relaxation and relieve discomfort and anxiety. Moderate conscious sedation is a type of sedation.   Under moderate conscious sedation, you are less alert than normal, but you are still able to respond to instructions, touch, or both. Moderate conscious sedation is used during short medical and dental procedures. It is milder than deep sedation, which is a type of sedation under which you cannot be easily woken up. It is also milder than general anesthesia, which is the use of medicines to make you unconscious. Moderate conscious sedation allows you to return to your regular activities sooner. Tell a health care provider about:  Any allergies you have.  All medicines you are taking, including vitamins, herbs, eye drops, creams, and over-the-counter medicines.  Use of steroids (by mouth or creams).  Any problems you or family members have had with sedatives and anesthetic medicines.  Any blood disorders you have.  Any surgeries you have had.  Any medical conditions you have, such as sleep apnea.  Whether you are pregnant or may be pregnant.  Any use of cigarettes, alcohol, marijuana, or street drugs. What are the risks? Generally, this is a safe procedure. However, problems may occur, including:  Getting too much medicine (oversedation).  Nausea.  Allergic reaction to medicines.  Trouble breathing. If this happens, a breathing tube may be used to help with breathing. It will be removed when you are awake and breathing on your  own.  Heart trouble.  Lung trouble. What happens before the procedure? Staying hydrated Follow instructions from your health care provider about hydration, which may include:  Up to 2 hours before the procedure - you may continue to drink clear liquids, such as water, clear fruit juice, black coffee, and plain tea. Eating and drinking restrictions Follow instructions from your health care provider about eating and drinking, which may include:  8 hours before the procedure - stop eating heavy meals or foods such as meat, fried foods, or fatty foods.  6 hours before the procedure - stop eating light meals or foods, such as toast or cereal.  6 hours before the procedure - stop drinking milk or drinks that contain milk.  2 hours before the procedure - stop drinking clear liquids. Medicine Ask your health care provider about:  Changing or stopping your regular medicines. This is especially important if you are taking diabetes medicines or blood thinners.  Taking medicines such as aspirin and ibuprofen. These medicines can thin your blood. Do not take these medicines before your procedure if your health care provider instructs you not to.  Tests and exams  You will have a physical exam.  You may have blood tests done to show: ? How well your kidneys and liver are working. ? How well your blood can clot. General instructions  Plan to have someone take you home from the hospital or clinic.  If you will be going home right after the procedure, plan to have someone with you for 24 hours. What happens during the procedure?  An IV tube will be inserted into one of your veins.  Medicine to help you relax (sedative) will be given through the IV tube.  The medical or dental procedure will be performed. What happens after the procedure?  Your blood pressure, heart rate, breathing rate, and blood oxygen level will be monitored often until the medicines you were given have worn  off.  Do not drive for 24 hours. This information is not intended to replace advice given to you by your health care provider. Make sure you discuss any questions you have with your health care provider.   Document Revised: 11/17/2017 Document Reviewed: 03/26/2016 Elsevier Patient Education  2020 Elsevier Inc.  

## 2020-11-27 NOTE — Progress Notes (Signed)
Discharge instructions reviewed with pt and her husband (via telephone) both voice understanding.  

## 2020-11-27 NOTE — Progress Notes (Signed)
Ambulated in hallway tol well  No bleeding noted before or after ambulation.  

## 2020-11-27 NOTE — Brief Op Note (Signed)
  NEUROSURGERY BRIEF OPERATIVE  NOTE   PREOP DX: Anterior communicating artery aneurysm  POSTOP DX: Same  PROCEDURE: Diagnostic cerebral angiogram  SURGEON: Dr. Lisbeth Renshaw, MD  ANESTHESIA: IV Sedation with Local  EBL: Minimal  SPECIMENS: None  COMPLICATIONS: None  CONDITION: Stable to recovery  FINDINGS (Full report in [facility]): 1. Complete occlusion of Acom aneurysm approx 46mo after SAH and coil embolization.

## 2020-12-02 ENCOUNTER — Encounter (HOSPITAL_COMMUNITY): Payer: Self-pay

## 2020-12-02 ENCOUNTER — Other Ambulatory Visit: Payer: Self-pay | Admitting: Neurosurgery

## 2020-12-02 DIAGNOSIS — I609 Nontraumatic subarachnoid hemorrhage, unspecified: Secondary | ICD-10-CM

## 2020-12-04 ENCOUNTER — Other Ambulatory Visit: Payer: Self-pay

## 2020-12-04 ENCOUNTER — Ambulatory Visit
Admission: RE | Admit: 2020-12-04 | Discharge: 2020-12-04 | Disposition: A | Discharge status: Home / Self Care | Payer: PRIVATE HEALTH INSURANCE | Source: Ambulatory Visit | Attending: Family Medicine | Admitting: Family Medicine

## 2020-12-04 DIAGNOSIS — Z1231 Encounter for screening mammogram for malignant neoplasm of breast: Secondary | ICD-10-CM | POA: Diagnosis not present

## 2020-12-14 ENCOUNTER — Other Ambulatory Visit: Payer: Self-pay | Admitting: Family Medicine

## 2020-12-14 DIAGNOSIS — R928 Other abnormal and inconclusive findings on diagnostic imaging of breast: Secondary | ICD-10-CM

## 2020-12-14 DIAGNOSIS — N6489 Other specified disorders of breast: Secondary | ICD-10-CM

## 2020-12-15 ENCOUNTER — Ambulatory Visit
Admission: RE | Admit: 2020-12-15 | Discharge: 2020-12-15 | Disposition: A | Discharge status: Home / Self Care | Payer: PRIVATE HEALTH INSURANCE | Source: Ambulatory Visit | Attending: Family Medicine | Admitting: Family Medicine

## 2020-12-15 ENCOUNTER — Other Ambulatory Visit: Payer: Self-pay

## 2020-12-15 DIAGNOSIS — R928 Other abnormal and inconclusive findings on diagnostic imaging of breast: Secondary | ICD-10-CM

## 2020-12-15 DIAGNOSIS — N6489 Other specified disorders of breast: Secondary | ICD-10-CM | POA: Diagnosis present

## 2020-12-17 ENCOUNTER — Other Ambulatory Visit: Payer: Self-pay | Admitting: Family Medicine

## 2020-12-17 DIAGNOSIS — R928 Other abnormal and inconclusive findings on diagnostic imaging of breast: Secondary | ICD-10-CM

## 2020-12-17 DIAGNOSIS — N6081 Other benign mammary dysplasias of right breast: Secondary | ICD-10-CM

## 2021-11-18 ENCOUNTER — Other Ambulatory Visit: Payer: Self-pay | Admitting: Neurosurgery

## 2021-11-18 DIAGNOSIS — I609 Nontraumatic subarachnoid hemorrhage, unspecified: Secondary | ICD-10-CM

## 2021-11-19 ENCOUNTER — Other Ambulatory Visit: Payer: Self-pay | Admitting: Neurosurgery

## 2021-11-30 ENCOUNTER — Other Ambulatory Visit (HOSPITAL_COMMUNITY): Payer: No Typology Code available for payment source

## 2022-01-07 ENCOUNTER — Other Ambulatory Visit: Payer: Self-pay | Admitting: Neurosurgery

## 2022-01-07 DIAGNOSIS — I609 Nontraumatic subarachnoid hemorrhage, unspecified: Secondary | ICD-10-CM

## 2022-01-17 ENCOUNTER — Other Ambulatory Visit (HOSPITAL_COMMUNITY): Payer: Self-pay | Admitting: Physician Assistant

## 2022-01-18 ENCOUNTER — Ambulatory Visit (HOSPITAL_COMMUNITY)
Admission: RE | Admit: 2022-01-18 | Discharge: 2022-01-18 | Disposition: A | Discharge status: Home / Self Care | Payer: 59 | Source: Ambulatory Visit | Attending: Neurosurgery | Admitting: Neurosurgery

## 2022-01-18 ENCOUNTER — Other Ambulatory Visit: Payer: Self-pay | Admitting: Neurosurgery

## 2022-01-18 ENCOUNTER — Other Ambulatory Visit: Payer: Self-pay

## 2022-01-18 ENCOUNTER — Encounter (HOSPITAL_COMMUNITY): Payer: Self-pay

## 2022-01-18 DIAGNOSIS — Z87891 Personal history of nicotine dependence: Secondary | ICD-10-CM | POA: Diagnosis not present

## 2022-01-18 DIAGNOSIS — I609 Nontraumatic subarachnoid hemorrhage, unspecified: Secondary | ICD-10-CM

## 2022-01-18 DIAGNOSIS — I671 Cerebral aneurysm, nonruptured: Secondary | ICD-10-CM | POA: Insufficient documentation

## 2022-01-18 HISTORY — PX: IR US GUIDE VASC ACCESS RIGHT: IMG2390

## 2022-01-18 HISTORY — PX: IR ANGIO INTRA EXTRACRAN SEL INTERNAL CAROTID BILAT MOD SED: IMG5363

## 2022-01-18 HISTORY — PX: IR ANGIO VERTEBRAL SEL VERTEBRAL BILAT MOD SED: IMG5369

## 2022-01-18 LAB — PROTIME-INR
INR: 0.9 (ref 0.8–1.2)
Prothrombin Time: 12.6 seconds (ref 11.4–15.2)

## 2022-01-18 LAB — CBC WITH DIFFERENTIAL/PLATELET
Abs Immature Granulocytes: 0.02 10*3/uL (ref 0.00–0.07)
Basophils Absolute: 0.1 10*3/uL (ref 0.0–0.1)
Basophils Relative: 1 %
Eosinophils Absolute: 0.4 10*3/uL (ref 0.0–0.5)
Eosinophils Relative: 6 %
HCT: 41 % (ref 36.0–46.0)
Hemoglobin: 13.7 g/dL (ref 12.0–15.0)
Immature Granulocytes: 0 %
Lymphocytes Relative: 44 %
Lymphs Abs: 3.3 10*3/uL (ref 0.7–4.0)
MCH: 30 pg (ref 26.0–34.0)
MCHC: 33.4 g/dL (ref 30.0–36.0)
MCV: 89.7 fL (ref 80.0–100.0)
Monocytes Absolute: 0.6 10*3/uL (ref 0.1–1.0)
Monocytes Relative: 8 %
Neutro Abs: 3.1 10*3/uL (ref 1.7–7.7)
Neutrophils Relative %: 41 %
Platelets: 408 10*3/uL — ABNORMAL HIGH (ref 150–400)
RBC: 4.57 MIL/uL (ref 3.87–5.11)
RDW: 14.6 % (ref 11.5–15.5)
WBC: 7.5 10*3/uL (ref 4.0–10.5)
nRBC: 0 % (ref 0.0–0.2)

## 2022-01-18 LAB — BASIC METABOLIC PANEL
Anion gap: 9 (ref 5–15)
BUN: 15 mg/dL (ref 6–20)
CO2: 24 mmol/L (ref 22–32)
Calcium: 9.5 mg/dL (ref 8.9–10.3)
Chloride: 108 mmol/L (ref 98–111)
Creatinine, Ser: 0.82 mg/dL (ref 0.44–1.00)
GFR, Estimated: >60 mL/min (ref >60)
Glucose, Bld: 110 mg/dL — ABNORMAL HIGH (ref 70–99)
Potassium: 3.9 mmol/L (ref 3.5–5.1)
Sodium: 141 mmol/L (ref 135–145)

## 2022-01-18 MED ORDER — LIDOCAINE HCL 1 % IJ SOLN
INTRAMUSCULAR | Status: AC
  Administered 2022-01-18: 10 mL
  Filled 2022-01-18: qty 20

## 2022-01-18 MED ORDER — IOHEXOL 300 MG/ML  SOLN
100.0000 mL | Freq: Once | INTRAMUSCULAR | Status: AC | PRN
  Administered 2022-01-18: 54 mL via INTRAVENOUS

## 2022-01-18 MED ORDER — SODIUM CHLORIDE 0.9 % IV SOLN
INTRAVENOUS | Status: AC | PRN
  Administered 2022-01-18: 10 mL/h via INTRAVENOUS

## 2022-01-18 MED ORDER — MIDAZOLAM HCL 2 MG/2ML IJ SOLN
INTRAMUSCULAR | Status: AC | PRN
  Administered 2022-01-18: .5 mg via INTRAVENOUS

## 2022-01-18 MED ORDER — SODIUM CHLORIDE 0.9 % IV SOLN: INTRAVENOUS | Status: DC

## 2022-01-18 MED ORDER — FENTANYL CITRATE (PF) 100 MCG/2ML IJ SOLN
INTRAMUSCULAR | Status: AC
  Filled 2022-01-18: qty 2

## 2022-01-18 MED ORDER — MIDAZOLAM HCL 2 MG/2ML IJ SOLN
INTRAMUSCULAR | Status: AC
  Filled 2022-01-18: qty 2

## 2022-01-18 MED ORDER — HYDROCODONE-ACETAMINOPHEN 5-325 MG PO TABS: 1.0000 | ORAL_TABLET | ORAL | Status: DC | PRN

## 2022-01-18 MED ORDER — FENTANYL CITRATE (PF) 100 MCG/2ML IJ SOLN
INTRAMUSCULAR | Status: AC | PRN
  Administered 2022-01-18: 25 ug via INTRAVENOUS

## 2022-01-18 MED ORDER — HEPARIN SODIUM (PORCINE) 1000 UNIT/ML IJ SOLN
INTRAMUSCULAR | Status: AC
  Filled 2022-01-18: qty 2

## 2022-01-18 NOTE — Brief Op Note (Signed)
°  NEUROSURGERY BRIEF OPERATIVE  NOTE   PREOP DX: Acom aneurysm  POSTOP DX: Same  PROCEDURE: Diagnostic cerebral angiogram  SURGEON: Dr. Lisbeth Renshaw, MD  ANESTHESIA: IV Sedation with Local  EBL: Minimal  SPECIMENS: None  COMPLICATIONS: None  CONDITION: Stable to recovery  FINDINGS (Full report in [facility]): 1. Persistent occlusion of previously coiled Acom aneurysm 2 years after treatment.   Lisbeth Renshaw, MD [facility]

## 2022-01-18 NOTE — Progress Notes (Signed)
Pt ambulated without difficulty or bleeding.   Discharged home with her husband who will drive and stay with pt x 24 hrs. 

## 2022-01-18 NOTE — H&P (Signed)
°  Chief Complaint  Aneurysm  History of Present Illness  Sabrina [name] is a 51 y.o. female with a history of subarachnoid hemorrhage approximately 2 years ago at which time she underwent coil embolization of an anterior communicating artery aneurysm.  Patient has made an excellent neurologic recovery.  She comes in today for routine long-term angiographic follow-up.  Past Medical History   Past Medical History:  Diagnosis Date   Asthma in remission    Back pain, chronic    Fibromyalgia     Past Surgical History   Past Surgical History:  Procedure Laterality Date   BREAST BIOPSY Right 2014   benign   IR ANGIO INTRA EXTRACRAN SEL INTERNAL CAROTID BILAT MOD SED  02/09/2020   IR ANGIO INTRA EXTRACRAN SEL INTERNAL CAROTID UNI R MOD SED  11/27/2020   IR ANGIO VERTEBRAL SEL VERTEBRAL BILAT MOD SED  02/09/2020   IR ANGIOGRAM FOLLOW UP STUDY  02/09/2020   IR ANGIOGRAM FOLLOW UP STUDY  02/09/2020   IR ANGIOGRAM FOLLOW UP STUDY  02/09/2020   IR NEURO EACH ADD'L AFTER BASIC UNI RIGHT (MS)  02/09/2020   IR TRANSCATH/EMBOLIZ  02/09/2020   RADIOLOGY WITH ANESTHESIA N/A 02/09/2020   Procedure: RADIOLOGY WITH ANESTHESIA;  Surgeon: Consuella Lose, MD;  Location: [place];  Service: Radiology;  Laterality: N/A;    Social History   Social History   Tobacco Use   Smoking status: Every Day   Smokeless tobacco: Never  Substance Use Topics   Alcohol use: No   Drug use: No    Medications   Prior to Admission medications   Medication Sig Start Date End Date Taking? Authorizing Provider  albuterol (PROVENTIL HFA;VENTOLIN HFA) 108 (90 BASE) MCG/ACT inhaler Inhale 2 puffs into the lungs every 6 (six) hours as needed for wheezing or shortness of breath.   Yes [provider]  methylPREDNISolone (MEDROL) 4 MG TBPK tablet Take according to package insert 02/19/20  Yes Costella, Vista Mink, PA-C  methocarbamol (ROBAXIN-750) 750 MG tablet Take 1 tablet (750 mg total) by mouth 3 (three)  times daily as needed for muscle spasms. 02/19/20   Costella, Vista Mink, PA-C  niMODipine (NIMOTOP) 30 MG capsule Take 2 capsules (60 mg total) by mouth every 4 (four) hours for 10 days. 02/19/20 02/29/20  Costella, Vista Mink, PA-C    Allergies   Allergies  Allergen Reactions   Clarithromycin Nausea And Vomiting   Codeine Hives and Nausea And Vomiting   Codeine Rash and Other (See Comments)    Abdominal pain    Review of Systems  ROS  Neurologic Exam  Awake, alert, oriented Memory and concentration grossly intact Speech fluent, appropriate CN grossly intact Motor exam: Upper Extremities Deltoid Bicep Tricep Grip  Right 5/5 5/5 5/5 5/5  Left 5/5 5/5 5/5 5/5   Lower Extremities IP Quad PF DF EHL  Right 5/5 5/5 5/5 5/5 5/5  Left 5/5 5/5 5/5 5/5 5/5   Sensation grossly intact to LT  Impression  - 51 y.o. female 2 years status post subarachnoid hemorrhage presenting for routine long-term follow-up angiography  Plan  -Plan on proceeding with diagnostic cerebral angiogram  I have reviewed the details of the procedure as well as the associated risks, benefits, and alternatives with the patient.  All her questions today were answered.  She provided informed consent to proceed.  Consuella Lose, MD [facility]

## 2022-06-06 IMAGING — US IR ANGIO VETEBRAL SEL VERTEBRAL BILAT MOD SED
1 series · 1 of 1 positions shown · non-contrast
Comparison: none

PROCEDURE:
DIAGNOSTIC CEREBRAL ANGIOGRAM
HISTORY: The patient is a 50-year-old woman with a history of subarachnoid
hemorrhage 2 years ago at which time she underwent coil embolization
of an anterior communicating artery aneurysm. She has made an
excellent recovery. She did undergo short-term diagnostic angiogram
and presents today for routine longer-term follow-up angiography.
TECHNIQUE: CATHETERS AND WIRES
5-French JB-1 catheter

[Series 1: ir cnsa ang vrtbl sel vrtbl bi · 1 of 1 slices shown]
[im 1/1]
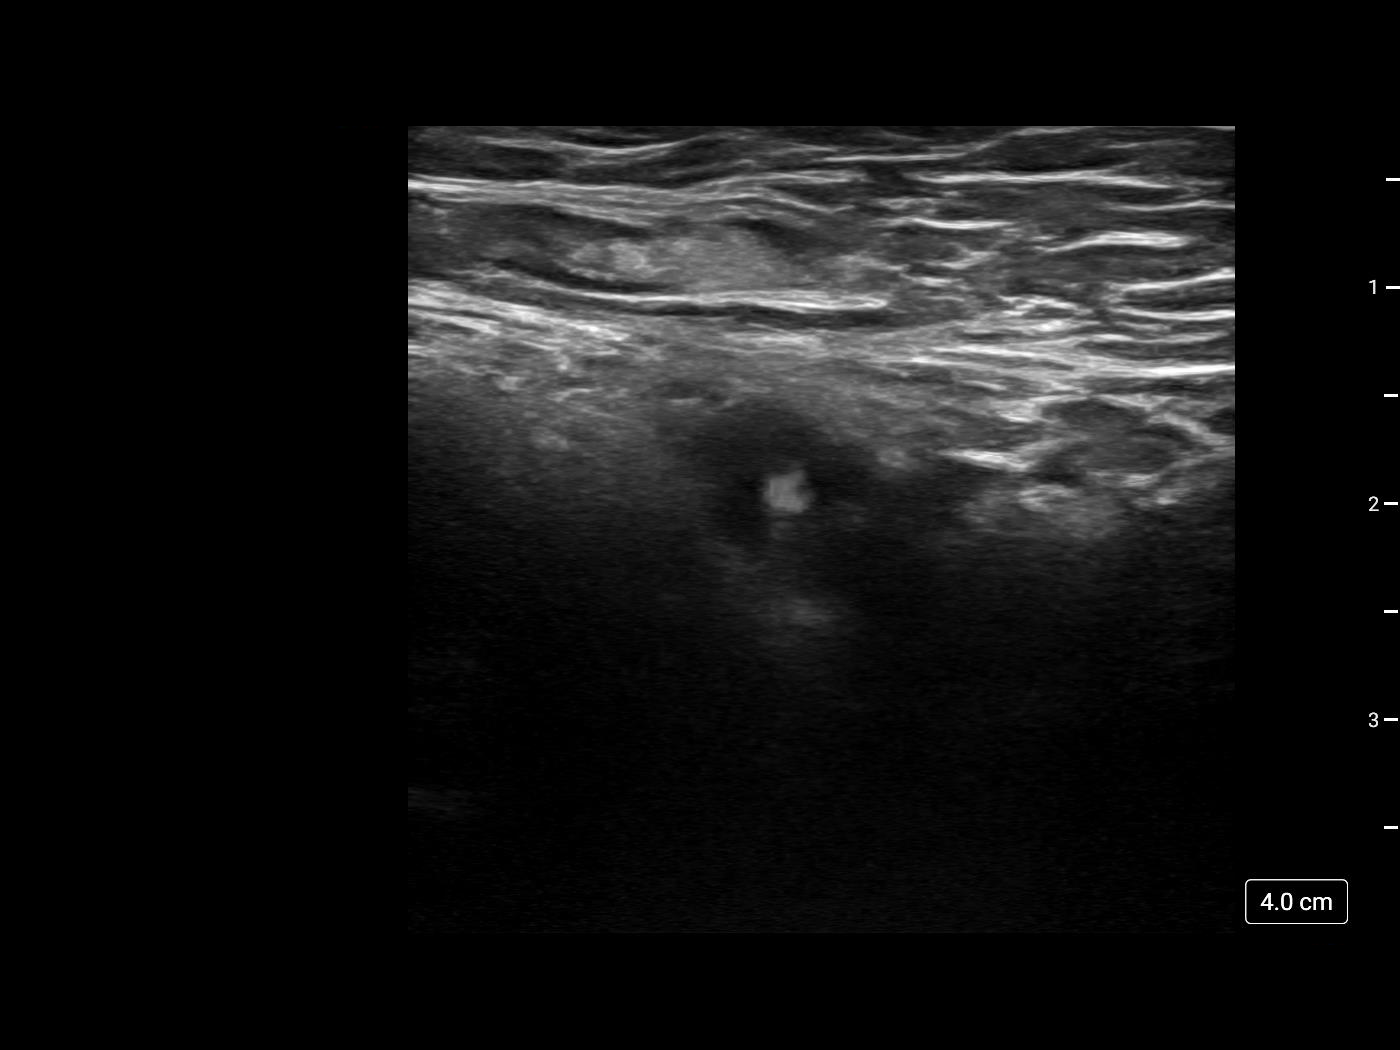

[1 of 1 positions shown; findings below may reference images not displayed]

ACCESS:
The technical aspects of the procedure as well as its potential
risks and benefits were reviewed with the patient. These risks
included but were not limited bleeding, infection, allergic
reaction, damage to organs or vital structures, stroke,
non-diagnostic procedure, and the catastrophic outcomes of heart
attack, coma, and death. With an understanding of these risks,
informed consent was obtained and witnessed. The patient was placed
in the supine position on the angiography table and the skin of
right groin prepped in the usual sterile fashion.

The procedure was performed under local anesthesia (1%-solution of
bicarbonate-buffered Lidocaine) and conscious sedation with 0.5mg
versed and 08micrograms fentanyl monitored by myself and the
in-suite nurse using continuous pulse-oximetry, heart rate, and
non-invasive blood-pressure.

A 5- French sheath was introduced in the right common femoral artery
using ultrasound guidance and Seldinger technique. This allowed
direct visualization of the micro puncture needle into the lumen of
the right common femoral artery. A fluoro-phase sequence was used to
document the sheath position.

MEDICATIONS:
HEPARIN: 9999 Units total.

CONTRAST:  54mL OMNIPAQUE IOHEXOL 300 MG/ML  SOLNcc, Omnipaque 300

FLUOROSCOPY TIME:  FLUOROSCOPY TIME: See IR records
0.035" glidewire

VESSELS CATHETERIZED
Right internal carotid

Left internal carotid

Left vertebral

Right vertebral

Right common femoral

VESSELS STUDIED
Right internal carotid, head

Left internal carotid, head

Left vertebral

Right vertebral

Right femoral

PROCEDURAL NARRATIVE
A 5-Fr JB-1 catheter was advanced over a 0.035 glidewire into the
aortic arch. The above vessels were then sequentially catheterized
and cervical / cerebral angiograms taken. After review of images,
the catheter was removed without incident.
FINDINGS: Right internal carotid, head:

Injection reveals the presence of a widely patent ICA, M1, and A1
segments and their branches. Coil mass in the region of the anterior
communicating artery is seen. There is no aneurysm residual or
recurrence. The anterior communicating artery and the distal ACA
branches remain widely patent. The parenchymal and venous phases are
normal. The venous sinuses are widely patent.

Left internal carotid, head:

Injection reveals the presence of a widely patent ICA, A1, and M1
segments and their branches. No aneurysms, AVMs, or high-flow
fistulas are seen. Again, there is no aneurysm filling seen. The
parenchymal and venous phases are normal. The venous sinuses are
widely patent.

Left vertebral:

Injection reveals the presence of a widely patent vertebral artery.
This leads to a widely patent basilar artery that terminates in
bilateral P1. The basilar apex is normal. No aneurysms, AVMs, or
high-flow fistulas are seen. The parenchymal and venous phases are
normal. The venous sinuses are widely patent.

Right vertebral:

The vertebral artery is widely patent. No PICA aneurysm is seen. See
basilar description above.

Right femoral:

Normal vessel. No significant atherosclerotic disease. Arterial
sheath in adequate position.

DISPOSITION:
Upon completion of the study, the femoral sheath was removed and
hemostasis obtained using a 5-Fr ExoSeal closure device. Good
proximal and distal lower extremity pulses were documented upon
achievement of hemostasis. The procedure was well tolerated and no
early complications were observed. The patient was transferred to
the holding area to lay flat for 2 hours.
IMPRESSION: 1. Persistent occlusion of a previously coiled anterior
communicating artery aneurysm 2 years after subarachnoid hemorrhage.

The preliminary results of this procedure were shared with the
patient and the patient's family.

## 2022-09-07 ENCOUNTER — Other Ambulatory Visit: Payer: Self-pay | Admitting: Family Medicine

## 2022-09-07 DIAGNOSIS — N6489 Other specified disorders of breast: Secondary | ICD-10-CM

## 2022-09-27 ENCOUNTER — Ambulatory Visit
Admission: RE | Admit: 2022-09-27 | Discharge: 2022-09-27 | Disposition: A | Discharge status: Home / Self Care | Payer: 59 | Source: Ambulatory Visit | Attending: Family Medicine | Admitting: Family Medicine

## 2022-09-27 DIAGNOSIS — N6489 Other specified disorders of breast: Secondary | ICD-10-CM | POA: Diagnosis present

## 2024-01-19 ENCOUNTER — Other Ambulatory Visit: Payer: Self-pay | Admitting: Neurosurgery

## 2024-01-19 DIAGNOSIS — I609 Nontraumatic subarachnoid hemorrhage, unspecified: Secondary | ICD-10-CM

## 2024-01-24 ENCOUNTER — Ambulatory Visit
Admission: RE | Admit: 2024-01-24 | Discharge: 2024-01-24 | Disposition: A | Discharge status: Home / Self Care | Payer: 59 | Source: Ambulatory Visit | Attending: Neurosurgery | Admitting: Neurosurgery

## 2024-01-24 DIAGNOSIS — I609 Nontraumatic subarachnoid hemorrhage, unspecified: Secondary | ICD-10-CM | POA: Diagnosis present

## 2024-03-31 LAB — EXTERNAL GENERIC LAB PROCEDURE: COLOGUARD: NEGATIVE
